# Patient Record
Sex: Female | Born: 1990 | Race: Black or African American | Hispanic: No | Marital: Single | Smoking: Never smoker
Health system: Southern US, Community
[De-identification: ages and names within clinical notes are randomized; demographics above are authoritative.]

## PROBLEM LIST (undated history)

## (undated) DIAGNOSIS — D649 Anemia, unspecified: Secondary | ICD-10-CM

## (undated) DIAGNOSIS — F32A Depression, unspecified: Secondary | ICD-10-CM

## (undated) DIAGNOSIS — J45909 Unspecified asthma, uncomplicated: Secondary | ICD-10-CM

## (undated) DIAGNOSIS — F419 Anxiety disorder, unspecified: Secondary | ICD-10-CM

## (undated) DIAGNOSIS — S329XXA Fracture of unspecified parts of lumbosacral spine and pelvis, initial encounter for closed fracture: Secondary | ICD-10-CM

## (undated) DIAGNOSIS — M1990 Unspecified osteoarthritis, unspecified site: Secondary | ICD-10-CM

## (undated) DIAGNOSIS — Z789 Other specified health status: Secondary | ICD-10-CM

## (undated) HISTORY — PX: COLOSTOMY: SHX63

## (undated) HISTORY — PX: BONY PELVIS SURGERY: SHX572

---
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
## 2013-04-17 ENCOUNTER — Emergency Department (HOSPITAL_COMMUNITY)
Admission: EM | Admit: 2013-04-17 | Discharge: 2013-04-18 | Disposition: A | Discharge status: Other Acute Inpatient Hospital | Payer: 59 | Attending: Emergency Medicine | Admitting: Emergency Medicine

## 2013-04-17 ENCOUNTER — Encounter (HOSPITAL_COMMUNITY): Payer: Self-pay | Admitting: Emergency Medicine

## 2013-04-17 DIAGNOSIS — F411 Generalized anxiety disorder: Secondary | ICD-10-CM | POA: Insufficient documentation

## 2013-04-17 DIAGNOSIS — Z8781 Personal history of (healed) traumatic fracture: Secondary | ICD-10-CM | POA: Insufficient documentation

## 2013-04-17 DIAGNOSIS — F3289 Other specified depressive episodes: Secondary | ICD-10-CM | POA: Insufficient documentation

## 2013-04-17 DIAGNOSIS — F419 Anxiety disorder, unspecified: Secondary | ICD-10-CM

## 2013-04-17 DIAGNOSIS — Z3202 Encounter for pregnancy test, result negative: Secondary | ICD-10-CM | POA: Insufficient documentation

## 2013-04-17 DIAGNOSIS — J45909 Unspecified asthma, uncomplicated: Secondary | ICD-10-CM | POA: Insufficient documentation

## 2013-04-17 DIAGNOSIS — F329 Major depressive disorder, single episode, unspecified: Secondary | ICD-10-CM

## 2013-04-17 DIAGNOSIS — F32A Depression, unspecified: Secondary | ICD-10-CM

## 2013-04-17 HISTORY — DX: Unspecified asthma, uncomplicated: J45.909

## 2013-04-17 LAB — CBC
HCT: 38.8 % (ref 36.0–46.0)
Hemoglobin: 13.1 g/dL (ref 12.0–15.0)
MCH: 31.5 pg (ref 26.0–34.0)
MCHC: 33.8 g/dL (ref 30.0–36.0)
MCV: 93.3 fL (ref 78.0–100.0)
PLATELETS: 182 10*3/uL (ref 150–400)
RBC: 4.16 MIL/uL (ref 3.87–5.11)
RDW: 11.9 % (ref 11.5–15.5)
WBC: 7 10*3/uL (ref 4.0–10.5)

## 2013-04-17 LAB — RAPID URINE DRUG SCREEN, HOSP PERFORMED
Amphetamines: NOT DETECTED
BARBITURATES: NOT DETECTED
BENZODIAZEPINES: NOT DETECTED
COCAINE: NOT DETECTED
Opiates: POSITIVE — AB
TETRAHYDROCANNABINOL: POSITIVE — AB

## 2013-04-17 LAB — POC URINE PREG, ED: Preg Test, Ur: NEGATIVE
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
## 2013-04-18 ENCOUNTER — Encounter (HOSPITAL_COMMUNITY): Payer: Self-pay | Admitting: *Deleted

## 2013-04-18 ENCOUNTER — Inpatient Hospital Stay (HOSPITAL_COMMUNITY)
Admission: AD | Admit: 2013-04-18 | Discharge: 2013-04-24 | DRG: 885 | Disposition: A | Discharge status: Home / Self Care | Payer: 59 | Source: Intra-hospital | Attending: Psychiatry | Admitting: Psychiatry

## 2013-04-18 DIAGNOSIS — F411 Generalized anxiety disorder: Secondary | ICD-10-CM

## 2013-04-18 DIAGNOSIS — J45909 Unspecified asthma, uncomplicated: Secondary | ICD-10-CM | POA: Diagnosis present

## 2013-04-18 DIAGNOSIS — R45851 Suicidal ideations: Secondary | ICD-10-CM

## 2013-04-18 DIAGNOSIS — F4310 Post-traumatic stress disorder, unspecified: Secondary | ICD-10-CM | POA: Diagnosis present

## 2013-04-18 DIAGNOSIS — F1210 Cannabis abuse, uncomplicated: Secondary | ICD-10-CM | POA: Diagnosis present

## 2013-04-18 DIAGNOSIS — F332 Major depressive disorder, recurrent severe without psychotic features: Secondary | ICD-10-CM | POA: Diagnosis present

## 2013-04-18 DIAGNOSIS — Z833 Family history of diabetes mellitus: Secondary | ICD-10-CM

## 2013-04-18 DIAGNOSIS — F329 Major depressive disorder, single episode, unspecified: Secondary | ICD-10-CM

## 2013-04-18 DIAGNOSIS — F1010 Alcohol abuse, uncomplicated: Secondary | ICD-10-CM | POA: Diagnosis present

## 2013-04-18 DIAGNOSIS — F339 Major depressive disorder, recurrent, unspecified: Secondary | ICD-10-CM | POA: Diagnosis present

## 2013-04-18 DIAGNOSIS — F3289 Other specified depressive episodes: Secondary | ICD-10-CM

## 2013-04-18 LAB — COMPREHENSIVE METABOLIC PANEL
ALBUMIN: 4.4 g/dL (ref 3.5–5.2)
ALT: 12 U/L (ref 0–35)
AST: 17 U/L (ref 0–37)
Alkaline Phosphatase: 95 U/L (ref 39–117)
BUN: 11 mg/dL (ref 6–23)
CALCIUM: 10.2 mg/dL (ref 8.4–10.5)
CO2: 25 mEq/L (ref 19–32)
Chloride: 102 mEq/L (ref 96–112)
Creatinine, Ser: 0.67 mg/dL (ref 0.50–1.10)
GFR calc Af Amer: >90 mL/min (ref >90)
GFR calc non Af Amer: >90 mL/min (ref >90)
Glucose, Bld: 95 mg/dL (ref 70–99)
Potassium: 4 mEq/L (ref 3.7–5.3)
Sodium: 141 mEq/L (ref 137–147)
Total Bilirubin: <0.2 mg/dL — ABNORMAL LOW (ref 0.3–1.2)
Total Protein: 8.2 g/dL (ref 6.0–8.3)

## 2013-04-18 LAB — ETHANOL: Alcohol, Ethyl (B): <11 mg/dL (ref 0–11)

## 2013-04-18 MED ORDER — HYDROXYZINE HCL 50 MG PO TABS
50.0000 mg | ORAL_TABLET | Freq: Every evening | ORAL | Status: DC | PRN
  Administered 2013-04-18 – 2013-04-20 (×3): 50 mg via ORAL
  Filled 2013-04-18 (×3): qty 1

## 2013-04-18 MED ORDER — ONDANSETRON HCL 4 MG PO TABS: 4.0000 mg | ORAL_TABLET | Freq: Three times a day (TID) | ORAL | Status: DC | PRN

## 2013-04-18 MED ORDER — ALUM & MAG HYDROXIDE-SIMETH 200-200-20 MG/5ML PO SUSP: 30.0000 mL | ORAL | Status: DC | PRN

## 2013-04-18 MED ORDER — ALUM & MAG HYDROXIDE-SIMETH 200-200-20 MG/5ML PO SUSP
30.0000 mL | ORAL | Status: DC | PRN
Start: 2013-04-18 — End: 2013-04-18

## 2013-04-18 MED ORDER — ACETAMINOPHEN 325 MG PO TABS
650.0000 mg | ORAL_TABLET | ORAL | Status: DC | PRN
Start: 2013-04-18 — End: 2013-04-24
  Administered 2013-04-19: 650 mg via ORAL
  Filled 2013-04-18: qty 2

## 2013-04-18 MED ORDER — IBUPROFEN 200 MG PO TABS: 600.0000 mg | ORAL_TABLET | Freq: Three times a day (TID) | ORAL | Status: DC | PRN

## 2013-04-18 MED ORDER — MAGNESIUM HYDROXIDE 400 MG/5ML PO SUSP: 30.0000 mL | Freq: Every day | ORAL | Status: DC | PRN

## 2013-04-18 MED ORDER — ACETAMINOPHEN 325 MG PO TABS: 650.0000 mg | ORAL_TABLET | ORAL | Status: DC | PRN

## 2013-04-18 NOTE — BH Assessment (Signed)
[facility] Gilmore LarocheAkhtar assigned to bed 602-2. Support paperwork is complete.  Glorious PeachNajah Tadarrius Burch, MS, LCASA Assessment Counselor

## 2013-04-18 NOTE — ED Notes (Addendum)
Pt has 3 bags of personal belongings at triage.
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
## 2013-04-18 NOTE — Progress Notes (Signed)
Recreation Therapy Notes  Date: 02.27.2015 Time: 2:45pm Location: 100 Hall Dayroom    Group Topic: Communication, Team Building, Problem Solving  Goal Area(s) Addresses:  Patient will effectively work with peer towards shared goal.  Patient will identify skill used to make activity successful.  Patient will identify how skills used during activity can be used to build healthy support system.   Behavioral Response: Did not attend.   Marykay Lexenise L Eulan Heyward, LRT/CTRS  Surya Schroeter L 04/18/2013 4:18 PM

## 2013-04-18 NOTE — Progress Notes (Signed)
[facility] Group Notes:  (Nursing/MHT/Case Management/Adjunct)  Date:  04/18/2013  Time:  8:00 p.m.   Type of Therapy:  Psychoeducational Skills  Participation Level:  Active  Participation Quality:  Attentive  Affect:  Depressed  Cognitive:  Appropriate  Insight:  Improving  Engagement in Group:  Developing/Improving  Modes of Intervention:  Education  Summary of Progress/Problems: The patient verbalized that she had a good day overall. The patient shared that today was her first day in the hospital and that she was feeling better already. In terms of the theme of the day, her relapse prevention will involve taking "showers", writing, and coloring.   Hazle CocaGOODMAN, Maria Coin S 04/18/2013, 9:52 PM

## 2013-04-18 NOTE — ED Notes (Signed)
Patient appears drowsy. Denies SI, HI, AVH at present.   Belongings reviewed, environment adjusted, encouragement offered.  Patient safety maintained, Q 15 checks in place.

## 2013-04-18 NOTE — Progress Notes (Signed)
MHT intiated outside bed placement for treatment at the following hospitals with bed availability:  1)[place] 2)[facility] 3)[facility] 4[facility] 5)[facility] 7)[facility] 8)[facility]  Blain PaisMichelle L Les Longmore, MHT/NS
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
## 2013-04-18 NOTE — ED Notes (Signed)
Telepsych in progress. 

## 2013-04-18 NOTE — Tx Team (Signed)
Initial Interdisciplinary Treatment Plan  PATIENT STRENGTHS: (choose at least two) Ability for insight Average or above average intelligence Capable of independent living Supportive family/friends  PATIENT STRESSORS: Financial difficulties Legal issue Marital or family conflict   PROBLEM LIST: Problem List/Patient Goals Date to be addressed Date deferred Reason deferred Estimated date of resolution  depression 04/18/2013     anxiety 04/18/2013                                                DISCHARGE CRITERIA:  Improved stabilization in mood, thinking, and/or behavior Need for constant or close observation no longer present Reduction of life-threatening or endangering symptoms to within safe limits  PRELIMINARY DISCHARGE PLAN: Outpatient therapy Return to previous living arrangement  PATIENT/FAMIILY INVOLVEMENT: This treatment plan has been presented to and reviewed with the patient, Ernesto RutherfordYadyra M Andreas.  The patient and family have been given the opportunity to ask questions and make suggestions.  Leighton Parodyyson, Claudia Greenley M 04/18/2013, 4:44 PM

## 2013-04-18 NOTE — ED Notes (Signed)
Pt is awake and alert, pleasant and cooperative. Patient denies HI, SI AH or VH. Discharge vitals 99/69 HR 88 RR 16 and unlabored. [facility]. Will continue to monitor for safety. Patient escorted to lobby with [facility] transportation and MHTs  without incident. T.Melvyn NethLewis RN

## 2013-04-19 ENCOUNTER — Encounter (HOSPITAL_COMMUNITY): Payer: Self-pay | Admitting: Psychiatry

## 2013-04-19 DIAGNOSIS — F411 Generalized anxiety disorder: Secondary | ICD-10-CM | POA: Diagnosis present

## 2013-04-19 DIAGNOSIS — F1910 Other psychoactive substance abuse, uncomplicated: Secondary | ICD-10-CM

## 2013-04-19 MED ORDER — IBUPROFEN 800 MG PO TABS
800.0000 mg | ORAL_TABLET | Freq: Three times a day (TID) | ORAL | Status: DC | PRN
  Administered 2013-04-19 – 2013-04-24 (×11): 800 mg via ORAL
  Filled 2013-04-19 (×12): qty 1

## 2013-04-19 MED ORDER — HYDROXYZINE HCL 25 MG PO TABS
25.0000 mg | ORAL_TABLET | Freq: Four times a day (QID) | ORAL | Status: DC | PRN
  Administered 2013-04-22 – 2013-04-24 (×4): 25 mg via ORAL
  Filled 2013-04-19 (×5): qty 1

## 2013-04-19 MED ORDER — SERTRALINE HCL 25 MG PO TABS
25.0000 mg | ORAL_TABLET | Freq: Every day | ORAL | Status: DC
  Administered 2013-04-19 – 2013-04-22 (×4): 25 mg via ORAL
  Filled 2013-04-19 (×6): qty 1
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
## 2013-04-19 NOTE — Progress Notes (Signed)
Psychoeducational Group Note  Date:  04/19/2013 Time:  8:00 pm.   Group Topic/Focus:  Wrap-Up Group:   The focus of this group is to help patients review their daily goal of treatment and discuss progress on daily workbooks.  Participation Level: Did Not Attend  Participation Quality:  Not Applicable  Affect:  Not Applicable  Cognitive:  Not Applicable  Insight:  Not Applicable  Engagement in Group: Not Applicable  Additional Comments:  The patient didn't attend group this evening since she was asleep in her bed.   Hazle CocaGOODMAN, Chardae Mulkern S 04/19/2013, 9:20 PM
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
## 2013-04-19 NOTE — Progress Notes (Signed)
D.  Pt pleasant on approach, requested heat packs for arthritis.  Did not attend evening wrap up group.  Interacting appropriately with peers on unit.  Denies SI/HI/hallucinations at this time.  A.  Support and encouragement offered.  Few heat packs available, went to children's unit and got three  R.  Pt remains safe on unit, pleased to receive heat packs, will continue to monitor.
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
## 2013-04-19 NOTE — [facility] Group Notes (Signed)
[facility] Group Notes:  (Clinical Social Work)  04/19/2013   1:15-2:15PM  Summary of Progress/Problems:   The main focus of today's process group was for the patient to identify ways in which they have sabotaged their own mental health wellness/recovery.  Motivational interviewing and a handout were used to explore the benefits and costs of their self-sabotaging behavior as well as the benefits and costs of changing this behavior.  The Stages of Change were explained to the group using a handout, and it was explained to patients the importance of making specific plans for how to handle self-sabotaging behaviors. The patient expressed she self-sabotages with acting as though she does not care, isolating herself emotionally.  The benefit of this self-defeating behavior is that it makes things not real and pushes people away so she cannot be hurt and the cost is that in reality she truly does have feelings that are being denied.  The benefit for her of changing this would be to be able to look at everything in life differently.  Type of Therapy:  Process Group  Participation Level:  Active  Participation Quality:  Attentive, Sharing and Supportive  Affect:  Appropriate  Cognitive:  Appropriate  Insight:  Engaged  Engagement in Therapy:  Engaged  Modes of Intervention:  Education, Motivational Interviewing   Whitney MantleMareida Grossman-Orr, LCSW 04/19/2013, 4:00pm

## 2013-04-19 NOTE — H&P (Signed)
  Pt was seen by me today and I agree with the key elements documented in H&P.  

## 2013-04-19 NOTE — Progress Notes (Signed)
Whitney Simpson is OOB UAL on the unit, interacting with her peers and the staff appropriately. She takes her meds as planned and she attends her groups.   A She completed her morning self inventory and on it she writes she denies SI within the past 24 hrs, she rates her depression and hopelessness  "7/4" and she stated her DC plan is to " either change my surroundings or change the people I'm dealing with".   R Safety is in place and poc moves forward.

## 2013-04-19 NOTE — [facility] Suicide Risk Assessment (Signed)
Suicide Risk Assessment  Admission Assessment     Nursing information obtained from:  Patient Demographic factors:  Adolescent or young adult;Low socioeconomic status;Unemployed Current Mental Status:  Self-harm thoughts Loss Factors:  Legal issues;Financial problems / change in socioeconomic status Historical Factors:  Prior suicide attempts Risk Reduction Factors:  Responsible for children under 21 years of age;Sense of responsibility to family Total Time spent with patient: 30 minutes  CLINICAL FACTORS:   Depression:   Hopelessness    COGNITIVE FEATURES THAT CONTRIBUTE TO RISK:  Closed-mindedness    SUICIDE RISK:   Mild:  Suicidal ideation of limited frequency, intensity, duration, and specificity.  There are no identifiable plans, no associated intent, mild dysphoria and related symptoms, good self-control (both objective and subjective assessment), few other risk factors, and identifiable protective factors, including available and accessible social support.  PLAN OF CARE:  Continue current treatment  I certify that inpatient services furnished can reasonably be expected to improve the patient's condition.  Wonda CeriseRasul, Bobie Caris 04/19/2013, 12:55 PM

## 2013-04-20 DIAGNOSIS — F411 Generalized anxiety disorder: Secondary | ICD-10-CM

## 2013-04-20 DIAGNOSIS — F332 Major depressive disorder, recurrent severe without psychotic features: Principal | ICD-10-CM

## 2013-04-20 DIAGNOSIS — R45851 Suicidal ideations: Secondary | ICD-10-CM

## 2013-04-20 DIAGNOSIS — F4310 Post-traumatic stress disorder, unspecified: Secondary | ICD-10-CM

## 2013-04-20 DIAGNOSIS — F1010 Alcohol abuse, uncomplicated: Secondary | ICD-10-CM

## 2013-04-20 MED ORDER — BUTALBITAL-APAP-CAFFEINE 50-325-40 MG PO TABS
1.0000 | ORAL_TABLET | Freq: Four times a day (QID) | ORAL | Status: DC | PRN
  Administered 2013-04-20 – 2013-04-22 (×4): 1 via ORAL
  Filled 2013-04-20 (×4): qty 1

## 2013-04-20 MED ORDER — HYDROCORTISONE 0.5 % EX CREA
TOPICAL_CREAM | Freq: Four times a day (QID) | CUTANEOUS | Status: DC
  Administered 2013-04-21 – 2013-04-23 (×6): via TOPICAL
  Filled 2013-04-20 (×2): qty 28.35

## 2013-04-20 NOTE — Progress Notes (Signed)
[facility] Group Notes:  (Nursing/MHT/Case Management/Adjunct)  Date:  04/20/2013  Time:  8:00 p.m.   Type of Therapy:  Psychoeducational Skills  Participation Level:  Active  Participation Quality:  Attentive  Affect:  Flat  Cognitive:  Appropriate  Insight:  Improving  Engagement in Group:  Limited  Modes of Intervention:  Education  Summary of Progress/Problems: The patient described her day as having been "complicated". She explained to the group that she had a bad telephone call earlier in the day in which she learned that she will no longer have the family support that she expected. As a theme for the day, she was unable to list her support system.   Hazle CocaGOODMAN, Daiden Coltrane S 04/20/2013, 9:03 PM

## 2013-04-20 NOTE — Progress Notes (Signed)
Late Entry for 04-19-13 Entered on 04-20-13  .Psychoeducational Group Note    Date: 04/20/2013 Time: 0930   Goal Setting Purpose of Group: To be able to set a goal that is measurable and that can be accomplished in one day Participation Level:  Active  Participation Quality:  Appropriate  Affect:  Appropriate  Cognitive:  Oriented  Insight:  Improving  Engagement in Group:  Engaged  Additional Comments:    Khloee Garza A 

## 2013-04-20 NOTE — Progress Notes (Signed)
Psychoeducational Group Note  Date: 04/20/2013 Time:  0930  Group Topic/Focus:  Gratefulness:  The focus of this group is to help patients identify what two things they are most grateful for in their lives. What helps ground them and to center them on their work to their recovery.  Participation Level:  Active  Participation Quality:  Appropriate  Affect:  Appropriate  Cognitive:  Oriented  Insight:  improving  Engagement in Group:  Engaged  Additional Comments:    Kendrell Lottman A   

## 2013-04-20 NOTE — Progress Notes (Signed)
Psychoeducational Group Note  Date: 04/20/2013 Time:  0930  Group Topic/Focus:  Gratefulness:  The focus of this group is to help patients identify what two things they are most grateful for in their lives. What helps ground them and to center them on their work to their recovery.  Participation Level:  Active  Participation Quality:  Appropriate  Affect:  Appropriate  Cognitive:  Oriented  Insight:  improving  Engagement in Group:  Engaged  Additional Comments:    Aj Crunkleton A   
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
## 2013-04-20 NOTE — Progress Notes (Signed)
D.  Pt pleasant on approach, does have complaint of headache that she says is becoming a migraine..  Pt also has small red area under elbow that itches and requested cream for this.   Denies SI/HI/hallucinations at this time.  Positive for evening wrap up group, interacting appropriately within milieu.  A.  Support and encouragement offered.  Called NP to get orders for migraine and raised red area, orders received.  R.  Pt pleased with new order, went to bed early due to headache.

## 2013-04-20 NOTE — Progress Notes (Signed)
Late entry for 04-19-13 Entered on 04-20-13  Psychoeducational Group Note  Date: 04/20/2013 Time:  1015  Group Topic/Focus:  Identifying Needs:   The focus of this group is to help patients identify their personal needs that have been historically problematic and identify healthy behaviors to address their needs.  Participation Level:  Active  Participation Quality:  Appropriate  Affect:  Appropriate  Cognitive:  Oriented  Insight:  Improving  Engagement in Group:  Engaged  Additional Comments:    Adreyan Carbajal A  

## 2013-04-21 LAB — RPR: RPR: NONREACTIVE

## 2013-04-21 MED ORDER — TRAZODONE HCL 50 MG PO TABS
50.0000 mg | ORAL_TABLET | Freq: Every evening | ORAL | Status: DC | PRN
  Administered 2013-04-21 – 2013-04-23 (×3): 50 mg via ORAL
  Filled 2013-04-21 (×3): qty 1

## 2013-04-21 NOTE — Clinical Social Work Note (Signed)
CSW contacted [name] [phone], ext [phone]) per pt's request.  Consent received and in the chart.  CSW informed Ms. Whitney Simpson of pt's admission date and currently still being in the hospital.    Whitney IvanChelsea Horton, LCSW 04/21/2013  2:54 PM
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
## 2013-04-21 NOTE — Tx Team (Signed)
Interdisciplinary Treatment Plan Update (Adult)  Date: 04/21/2013  Time Reviewed:  9:45 AM  Progress in Treatment: Attending groups: Yes Participating in groups:  Yes Taking medication as prescribed:  Yes Tolerating medication:  Yes Family/Significant othe contact made: CSW assessing  Patient understands diagnosis:  Yes Discussing patient identified problems/goals with staff:  Yes Medical problems stabilized or resolved:  Yes Denies suicidal/homicidal ideation: Yes Issues/concerns per patient self-inventory:  Yes Other:  New problem(s) identified: N/A  Discharge Plan or Barriers: CSW assessing for appropriate referrals.  Reason for Continuation of Hospitalization: Anxiety Depression Medication Stabilization  Comments: N/A  Estimated length of stay: 3-5 days  For review of initial/current patient goals, please see plan of care.  Attendees: Patient:     Family:     Physician:  Dr. Johnalagadda 04/21/2013 10:51 AM   Nursing:   Carol Davis, RN 04/21/2013 10:51 AM   Clinical Social Worker:  Consandra Laske Horton, LCSW 04/21/2013 10:51 AM   Other: Conrad Withrow, PA 04/21/2013 10:51 AM   Other:  Valerie Noch, care coordination 04/21/2013 10:51 AM   Other:  Quylle Hodnett, LCSW 04/21/2013 10:51 AM   Other:  Beverly Knight, RN 04/21/2013 10:51 AM   Other:  Dana Green, RN 04/21/2013 10:53 AM   Other: Xavier Pandimakeel, LCSW 04/21/2013 10:53 AM   Other: Kim Maggio, RN 04/21/2013 10:53 AM   Other:    Other:    Other:     Scribe for Treatment Team:   Horton, Rudie Rikard Nicole, 04/21/2013 10:51 AM    

## 2013-04-21 NOTE — [facility] Suicide Risk Assessment (Signed)
[facility] Family/Significant Other Suicide Prevention Education  Suicide Prevention Education:   Patient Refusal for Family/Significant Other Suicide Prevention Education: The patient has refused to provide written consent for family/significant other to be provided Family/Significant Other Suicide Prevention Education during admission and/or prior to discharge.  Physician notified.  CSW provided suicide prevention information with patient.    The suicide prevention education provided includes the following:  Suicide risk factors  Suicide prevention and interventions  National Suicide Hotline telephone number  [facility] assessment telephone number  [facility] Emergency Assistance 911  [facility] and/or Residential Mobile Crisis Unit telephone number   Reyes IvanChelsea Horton, [place] 04/21/2013 2:57 PM

## 2013-04-22 LAB — GC/CHLAMYDIA PROBE AMP
CT Probe RNA: NEGATIVE
GC Probe RNA: NEGATIVE

## 2013-04-22 LAB — HIV ANTIBODY (ROUTINE TESTING W REFLEX): HIV: NONREACTIVE

## 2013-04-22 MED ORDER — SERTRALINE HCL 50 MG PO TABS
50.0000 mg | ORAL_TABLET | Freq: Every day | ORAL | Status: DC
  Administered 2013-04-23 – 2013-04-24 (×2): 50 mg via ORAL
  Filled 2013-04-22: qty 14
  Filled 2013-04-22 (×4): qty 1
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
## 2013-04-22 NOTE — [facility] Group Notes (Signed)
[facility] LCSW Group Therapy  04/22/2013  1:15 PM   Type of Therapy:  Group Therapy  Participation Level:  Active  Participation Quality:  Attentive, Sharing and Supportive  Affect:  Depressed and Flat  Cognitive:  Alert and Oriented  Insight:  Developing/Improving and Engaged  Engagement in Therapy:  Developing/Improving and Engaged  Modes of Intervention:  Activity, Clarification, Confrontation, Discussion, Education, Exploration, Limit-setting, Orientation, Problem-solving, Rapport Building, Dance movement psychotherapisteality Testing, Socialization and Support  Summary of Progress/Problems:Patient was attentive and engaged with speaker from [employer].  Patient was attentive to speaker while they shared their story of dealing with mental health and overcoming it.  Patient expressed interest in their programs and services and received information on their agency.  Patient processed ways they can relate to the speaker.  Pt asked multiple questions to help relate to the speaker and her situation.    Whitney IvanChelsea Horton, LCSW 04/22/2013 2:33 PM

## 2013-04-22 NOTE — [facility] Group Notes (Signed)
Adult Psychoeducational Group Note  Date:  04/22/2013 Time:  11:58 PM  Group Topic/Focus:  Wrap-Up Group:   The focus of this group is to help patients review their daily goal of treatment and discuss progress on daily workbooks.  Participation Level:  Did Not Attend  Participation Quality:  None  Affect:  None  Cognitive:  None  Insight: None  Engagement in Group:  None  Modes of Intervention:  None  Additional Comments:  Pt stated that she didn't want to attend group due to not wanting to be around a certain patient. Pt elected to miss group and stay in her room after encouragement, of staff to attend.  Whitney Simpson, Whitney Simpson 04/22/2013, 11:58 PM

## 2013-04-22 NOTE — Progress Notes (Signed)
The focus of this group is to educate the patient on the purpose and policies of crisis stabilization and provide a format to answer questions about their admission.  The group details unit policies and expectations of patients while admitted. Patient attended this group. 
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
## 2013-04-22 NOTE — Progress Notes (Signed)
Pt reports she is doing a little better today.  Her primary complaint is her chronic pain to her R hip and the migraine that she says has persisted for the last couple of days.  Meds given as ordered.  She denies SI/HI/AV at this time.  She says she has been participating and going to groups.  Pt makes her needs known to staff.  Support and encouragement offered.  Discharge plans are still in process.  Safety maintained with q15 minute checks.

## 2013-04-22 NOTE — Progress Notes (Signed)
D: Patient denies SI/HI and A/V hallucinations; patient reports sleep is fair and that she requested medication; reports appetite is improving ; reports energy level is normal ; reports ability to pay attention is good; rates depression as 7/10; rates hopelessness 2/10; patient reports " im scared, nervous, and worried about the changes to make to better take care of herself"; patient had some complaints of anxiety and cramps  A: Monitored q 15 minutes; patient encouraged to attend groups; patient educated about medications; patient given medications per physician orders; patient encouraged to express feelings and/or concerns  R: Patient is cooperative and appropriate to circumstances; patient reports relief of the anxiety after the medication; patient reports relief of cramps after medication and heat packs; patient's interaction with staff and peers is appropriate but can make inappropriate statements at times; patient was able to set goal to talk with staff 1:1 when having feelings of SI; patient is taking medications as prescribed and tolerating medications; patient is attending all groups

## 2013-04-22 NOTE — Progress Notes (Signed)
Recreation Therapy Notes  Animal-Assisted Activity/Therapy (AAA/T) Program Checklist/Progress Notes Patient Eligibility Criteria Checklist & Daily Group note for Rec Tx Intervention  Date: 03.03.2015 Time: 2:45pm Location: [address] Dayroom    AAA/T Program Assumption of Risk Form signed by Patient/ or Parent Legal Guardian yes  Patient is free of allergies or sever asthma yes  Patient reports no fear of animals yes  Patient reports no history of cruelty to animals yes   Patient understands his/her participation is voluntary yes  Behavioral Response: Did not attend.   Jahni Nazar L Niyla Marone, LRT/CTRS  Darin Redmann L 04/22/2013 4:57 PM 

## 2013-04-23 NOTE — Tx Team (Signed)
Interdisciplinary Treatment Plan Update (Adult)  Date: 04/23/2013  Time Reviewed:  9:45 AM  Progress in Treatment: Attending groups: Yes Participating in groups:  Yes Taking medication as prescribed:  Yes Tolerating medication:  Yes Family/Significant othe contact made: No, pt refused Patient understands diagnosis:  Yes Discussing patient identified problems/goals with staff:  Yes Medical problems stabilized or resolved:  Yes Denies suicidal/homicidal ideation: Yes Issues/concerns per patient self-inventory:  Yes Other:  New problem(s) identified: N/A  Discharge Plan or Barriers: [facility] [place], [place] for [place] medication management and therapy.    Reason for Continuation of Hospitalization: Anxiety Depression Medication Stabilization  Comments: N/A  Estimated length of stay: 1 day, d/c tomorrow  For review of initial/current patient goals, please see plan of care.  Attendees: Patient:     Family:     Physician:  Dr. Javier GlazierJohnalagadda 04/23/2013 9:59 AM   Nursing:   Marzetta Boardhrista Dopson, RN 04/23/2013 9:59 AM   Clinical Social Worker:  Reyes Ivanhelsea Horton, LCSW 04/23/2013 9:59 AM   Other: Claudette Headonrad Withrow, PA 04/23/2013 9:59 AM   Other:  Sherrye PayorValerie Noch, care coordination 04/23/2013 9:59 AM   Other:  Juline PatchQuylle Hodnett, LCSW 04/23/2013 9:59 AM   Other:  Harold Barbanonecia Byrd, RN 04/23/2013 10:00 AM   Other: Leighton ParodyBritney Tyson, RN 04/23/2013 10:00 AM   Other: Onnie BoerJennifer Clark, case manager 04/23/2013 10:03 AM   Other:    Other:    Other:      Scribe for Treatment Team:   Carmina MillerHorton, Amiel Sharrow Nicole, 04/23/2013 , 9:59 AM

## 2013-04-23 NOTE — Progress Notes (Signed)
Adult Psychoeducational Group Note  Date:  04/23/2013 Time:  9:55 PM  Group Topic/Focus:  Wrap-Up Group:   The focus of this group is to help patients review their daily goal of treatment and discuss progress on daily workbooks.  Participation Level:  Minimal  Participation Quality:  Appropriate  Affect:  Defensive  Cognitive:  Appropriate  Insight: Limited  Engagement in Group:  Limited  Modes of Intervention:  Education, Exploration, Socialization and Support  Additional Comments:  Patient attended and participated in group tonight. She reports that she went to her groups, attended her meals and went outside with the group today. For her personal development she is planning on going back to school,  Scot DockFrancis, Undrea Shipes Dacosta 04/23/2013, 9:55 PM

## 2013-04-23 NOTE — Progress Notes (Signed)
D: Patient denies SI/HI and A/V hallucinations; patient having complaints of anxiety this morning and she was reported that shen she takes the vistaril it helps with her anger but that she did not feel calm  A: Monitored q 15 minutes; patient encouraged to attend groups; patient educated about medications; patient given medications per physician orders; patient encouraged to express feelings and/or concerns  R: Patient given vistaril this morning and has been in the bed sleep today and has not come out of the room; patient's interaction with staff and peers is appropriate; patient was able to set goal to talk with staff 1:1 when having feelings of SI; patient is taking medications as prescribed and tolerating medications

## 2013-04-23 NOTE — [facility] Group Notes (Signed)
[facility]   04/23/2013 8:45 AM  Participation Quality:  Alert, Appropriate and Oriented  Mood/Affect:  Flat and Depressed  Depression Rating:  2  Anxiety Rating:  4  Thoughts of Suicide:  Pt denies SI/HI  Will you contract for safety?   Yes  Current AVH:  Pt denies  Plan for Discharge/Comments:  Pt attended discharge planning group and actively participated in group.  CSW provided pt with today's workbook.  Pt reports feeling ready to d/c soon.  Pt states that she realizes she has options now, such as shelters here in [place] and her grandmother said she can stay with her as well.  Pt states that she plans to work on staying here in [place] but also has a Nurse, mental healthtrain ticket back to [place] and can go back to her boyfriend if need be.  Pt states that her boyfriend is willing to go to therapy with her.  CSW [facility] [place] for outpatient medication management and therapy, in both cities, should pt decide to go back to [place].  No further needs voiced by pt at this time.    Transportation Means: Pt reports access to transportation - provided pt with bus passes and pt has a train ticket to [place]  Supports: No supports mentioned at this time  Reyes IvanChelsea Horton, LCSW 04/23/2013 9:52 AM

## 2013-04-23 NOTE — Progress Notes (Signed)
Pt reports she is doing good this evening.  She says she is being discharged tomorrow.  She plans to stay at a shelter for a while because she is still unsure about going home to her boyfriend.  She has a ticket in hand if she decides to return to [place] from.  Pt feels her medications are working for her.  The rash on her arm looks better.  She feels more hopeful instead of helpless.  Pt makes her needs known to staff, and voices no needs or concerns at this time.  Pt attends groups and participates.  Support and encouragement offered. Safety maintained with q15 minute checks.

## 2013-04-23 NOTE — Progress Notes (Signed)
Pt observed in the hallway with other patients talking and laughing with one another.  Pt has not made any complaints or voiced any concerns to this Clinical research associatewriter.  She says her cramps have decreased.  Pt says she has gone to most of the groups.  Pt feels the meds are working for her.  Pt denies SI/HI/AV.  Pt makes her needs known to staff.  Pt says she will probably go to a shelter from here at discharge.  Support and encouragement offered.  Safety maintained with q15 minute checks.

## 2013-04-23 NOTE — Progress Notes (Signed)
Patient ID: Whitney RutherfordYadyra M Simpson, female   DOB: 05/13/1990, 23 y.o.   MRN: [id number] Pt did not attend 10 am Psychoeducational group.

## 2013-04-23 NOTE — [facility] Group Notes (Signed)
[facility] LCSW Group Therapy  04/23/2013  1:15 PM   Type of Therapy:  Group Therapy  Participation Level:  Active  Participation Quality:  Attentive, Sharing and Supportive  Affect:  Depressed and Flat  Cognitive:  Alert and Oriented  Insight:  Developing/Improving and Engaged  Engagement in Therapy:  Developing/Improving and Engaged  Modes of Intervention:  Clarification, Confrontation, Discussion, Education, Exploration, Limit-setting, Orientation, Problem-solving, Rapport Building, Dance movement psychotherapisteality Testing, Socialization and Support  Summary of Progress/Problems: The topic for group today was emotional regulation.  This group focused on both positive and negative emotion identification and allowed group members to process ways to identify feelings, regulate negative emotions, and find healthy ways to manage internal/external emotions. Group members were asked to reflect on a time when their reaction to an emotion led to a negative outcome and explored how alternative responses using emotion regulation would have benefited them. Group members were also asked to discuss a time when emotion regulation was utilized when a negative emotion was experienced.  Pt shared that she struggles with the emotions of anger and frustration.  Pt explained that she doesn't like feeling controlled and lashes out when she feels she isn't in control.  Pt states that from her behaviors she feels she's pushed people away.  Pt states that writing and coloring helps her calm down and think before acting on negative emotions.  Pt actively participated and was engaged in group discussion.    Whitney IvanChelsea Horton, LCSW 04/23/2013 3:15 PM
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
## 2013-04-24 MED ORDER — HYDROXYZINE HCL 25 MG PO TABS
25.0000 mg | ORAL_TABLET | Freq: Four times a day (QID) | ORAL | Status: DC | PRN
Start: 2013-04-24 — End: 2020-06-19

## 2013-04-24 MED ORDER — SERTRALINE HCL 50 MG PO TABS: 50.0000 mg | ORAL_TABLET | Freq: Every day | ORAL | Status: DC

## 2013-04-24 MED ORDER — TRAZODONE HCL 50 MG PO TABS: 50.0000 mg | ORAL_TABLET | Freq: Every evening | ORAL | Status: DC | PRN

## 2013-04-24 MED ORDER — BUTALBITAL-APAP-CAFFEINE 50-325-40 MG PO TABS: 1.0000 | ORAL_TABLET | Freq: Four times a day (QID) | ORAL | Status: DC | PRN
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
## 2013-04-24 NOTE — Progress Notes (Signed)
Patient ID: Whitney RutherfordYadyra M Mackiewicz, female   DOB: 1990/07/31, 23 y.o.   MRN: [id number]  Patient's prescriptions were sent to the patient's pharmacy per patient's request. Writer called to follow-up and verbally gave Judeth CornfieldStephanie the pharmacist the list of medications as well.

## 2013-04-24 NOTE — Progress Notes (Signed)
Adult Psychoeducational Group Note  Date:  04/24/2013 Time:  11:39 AM  Group Topic/Focus:  Overcoming Stress:   The focus of this group is to define stress and help patients assess their triggers.  Participation Level:  Active  Participation Quality:  Monopolizing and Redirectable  Affect:  Appropriate  Cognitive:  Alert and Appropriate  Insight: Appropriate and Good  Engagement in Group:  Engaged and Supportive  Modes of Intervention:  Discussion, Education, Socialization and Support  Additional Comments:  Pt spoke freely in group and at times talked when others were talking. She does want to implement "being more carefree" when she goes home.  Reynolds BowlClement, Shevaun Lovan D 04/24/2013, 11:39 AM

## 2013-04-24 NOTE — Progress Notes (Signed)
Patient ID: [name], female   DOB: 04/18/1990, 23 y.o.   MRN: [id number]  Pt. Denies SI/HI and A/V hallucinations. Belongings returned to patient at time of discharge. Patient reports pain in hip from chronic pain but did not request any pain medication. Discharge instructions and medications were reviewed with patient. Patient verbalized understanding of both medications and discharge instructions. Patient stated that she could not wait for her prescriptions. They will be faxed to patient's pharmacy. Q15 minute safety checks were maintained until discharge. Patient did not have any s/s of distress upon discharge.

## 2013-04-24 NOTE — Progress Notes (Signed)
Patient ID: Whitney Simpson, female   DOB: 09/01/1990, 23 y.o.   MRN: [id number]  Morning Wellness Group 9:00 A.M.  The focus of this group is to educate the patient on the purpose and policies of crisis stabilization and provide a format to answer questions about their admission.  The group details unit policies and expectations of patients while admitted.  Patient attended group and participated in morning group exercises. Patient reports that after discharge she is going to go to [name] as a goal. Patient states that a leisure activity she enjoys is bubble bath.
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
## 2013-04-29 NOTE — Progress Notes (Signed)
Patient Discharge Instructions:  After Visit Summary (AVS):   Faxed to:  04/29/13 Discharge Summary Note:   Faxed to:  04/29/13 Psychiatric Admission Assessment Note:   Faxed to:  04/29/13 Suicide Risk Assessment - Discharge Assessment:   Faxed to:  04/29/13 Faxed/Sent to the Next Level Care provider:  04/29/13 [facility]axed to [facility] @ [phone] [facility] to [facility] @ [phone]  Jerelene ReddenSheena E [address], 04/29/2013, 4:15 PM

## 2013-12-22 ENCOUNTER — Encounter (HOSPITAL_COMMUNITY): Payer: Self-pay | Admitting: Psychiatry

## 2014-06-06 ENCOUNTER — Encounter (HOSPITAL_COMMUNITY): Payer: Self-pay | Admitting: Emergency Medicine

## 2014-06-06 DIAGNOSIS — W108XXA Fall (on) (from) other stairs and steps, initial encounter: Secondary | ICD-10-CM | POA: Diagnosis not present

## 2014-06-06 DIAGNOSIS — S7001XA Contusion of right hip, initial encounter: Secondary | ICD-10-CM | POA: Insufficient documentation

## 2014-06-06 DIAGNOSIS — Y9301 Activity, walking, marching and hiking: Secondary | ICD-10-CM | POA: Insufficient documentation

## 2014-06-06 DIAGNOSIS — S3992XA Unspecified injury of lower back, initial encounter: Secondary | ICD-10-CM | POA: Diagnosis not present

## 2014-06-06 DIAGNOSIS — Z8781 Personal history of (healed) traumatic fracture: Secondary | ICD-10-CM | POA: Insufficient documentation

## 2014-06-06 DIAGNOSIS — J45909 Unspecified asthma, uncomplicated: Secondary | ICD-10-CM | POA: Insufficient documentation

## 2014-06-06 DIAGNOSIS — Z79899 Other long term (current) drug therapy: Secondary | ICD-10-CM | POA: Diagnosis not present

## 2014-06-06 DIAGNOSIS — Y9289 Other specified places as the place of occurrence of the external cause: Secondary | ICD-10-CM | POA: Insufficient documentation

## 2014-06-06 DIAGNOSIS — Y998 Other external cause status: Secondary | ICD-10-CM | POA: Insufficient documentation

## 2014-06-06 DIAGNOSIS — S79911A Unspecified injury of right hip, initial encounter: Secondary | ICD-10-CM | POA: Diagnosis present

## 2014-06-06 NOTE — ED Notes (Signed)
Pt. tripped and fell from stairs this evening , no LOC / ambulatory , pt. reports right hip pain .

## 2014-06-07 ENCOUNTER — Emergency Department (HOSPITAL_COMMUNITY)
Admission: EM | Admit: 2014-06-07 | Discharge: 2014-06-07 | Disposition: A | Discharge status: Home / Self Care | Payer: Medicaid Other | Attending: Emergency Medicine | Admitting: Emergency Medicine

## 2014-06-07 ENCOUNTER — Emergency Department (HOSPITAL_COMMUNITY): Payer: Medicaid Other

## 2014-06-07 DIAGNOSIS — R52 Pain, unspecified: Secondary | ICD-10-CM

## 2014-06-07 DIAGNOSIS — S7001XA Contusion of right hip, initial encounter: Secondary | ICD-10-CM

## 2014-06-07 DIAGNOSIS — W19XXXA Unspecified fall, initial encounter: Secondary | ICD-10-CM

## 2014-06-07 MED ORDER — IBUPROFEN 400 MG PO TABS
600.0000 mg | ORAL_TABLET | Freq: Once | ORAL | Status: AC
  Administered 2014-06-07: 600 mg via ORAL
  Filled 2014-06-07 (×2): qty 1

## 2014-06-07 MED ORDER — IBUPROFEN 600 MG PO TABS: 600.0000 mg | ORAL_TABLET | Freq: Four times a day (QID) | ORAL | Status: DC | PRN

## 2014-06-07 MED ORDER — HYDROCODONE-ACETAMINOPHEN 5-325 MG PO TABS: 1.0000 | ORAL_TABLET | Freq: Four times a day (QID) | ORAL | Status: DC | PRN

## 2014-06-07 MED ORDER — TRAMADOL HCL 50 MG PO TABS
50.0000 mg | ORAL_TABLET | Freq: Once | ORAL | Status: AC
  Administered 2014-06-07: 50 mg via ORAL
  Filled 2014-06-07: qty 1

## 2014-06-07 NOTE — ED Notes (Signed)
Pt. Only able to ambulate to door. Pt states it hurts too much to walk. Pt. Able to walk independently with out assistance from RN

## 2014-06-07 NOTE — ED Notes (Signed)
Pt. Left with all belongings 

## 2014-06-07 NOTE — Discharge Instructions (Signed)
You had a fall, with hip pain. The Xrays are not showing any fracture. Please use the crutches to ambulate, and you should start getting better with weight bearing overtime. If not improving - please call the Orthopedic doctor.   Contusion A contusion is a deep bruise. Contusions are the result of an injury that caused bleeding under the skin. The contusion may turn blue, purple, or yellow. Minor injuries will give you a painless contusion, but more severe contusions may stay painful and swollen for a few weeks.  CAUSES  A contusion is usually caused by a blow, trauma, or direct force to an area of the body. SYMPTOMS   Swelling and redness of the injured area.  Bruising of the injured area.  Tenderness and soreness of the injured area.  Pain. DIAGNOSIS  The diagnosis can be made by taking a history and physical exam. An X-ray, CT scan, or MRI may be needed to determine if there were any associated injuries, such as fractures. TREATMENT  Specific treatment will depend on what area of the body was injured. In general, the best treatment for a contusion is resting, icing, elevating, and applying cold compresses to the injured area. Over-the-counter medicines may also be recommended for pain control. Ask your caregiver what the best treatment is for your contusion. HOME CARE INSTRUCTIONS   Put ice on the injured area.  Put ice in a plastic bag.  Place a towel between your skin and the bag.  Leave the ice on for 15-20 minutes, 3-4 times a day, or as directed by your health care provider.  Only take over-the-counter or prescription medicines for pain, discomfort, or fever as directed by your caregiver. Your caregiver may recommend avoiding anti-inflammatory medicines (aspirin, ibuprofen, and naproxen) for 48 hours because these medicines may increase bruising.  Rest the injured area.  If possible, elevate the injured area to reduce swelling. SEEK IMMEDIATE MEDICAL CARE IF:   You  have increased bruising or swelling.  You have pain that is getting worse.  Your swelling or pain is not relieved with medicines. MAKE SURE YOU:   Understand these instructions.  Will watch your condition.  Will get help right away if you are not doing well or get worse. Document Released: 11/16/2004 Document Revised: 02/11/2013 Document Reviewed: 12/12/2010 [facility] Patient Information 2015 [place], [place]. This information is not intended to replace advice given to you by your health care provider. Make sure you discuss any questions you have with your health care provider. Contusion A contusion is a deep bruise. Contusions happen when an injury causes bleeding under the skin. Signs of bruising include pain, puffiness (swelling), and discolored skin. The contusion may turn blue, purple, or yellow. HOME CARE   Put ice on the injured area.  Put ice in a plastic bag.  Place a towel between your skin and the bag.  Leave the ice on for 15-20 minutes, 03-04 times a day.  Only take medicine as told by your doctor.  Rest the injured area.  If possible, raise (elevate) the injured area to lessen puffiness. GET HELP RIGHT AWAY IF:   You have more bruising or puffiness.  You have pain that is getting worse.  Your puffiness or pain is not helped by medicine. MAKE SURE YOU:   Understand these instructions.  Will watch your condition.  Will get help right away if you are not doing well or get worse. Document Released: 07/26/2007 Document Revised: 05/01/2011 Document Reviewed: 12/12/2010 Exit[employer] Patient Information 2015  [employer]. This information is not intended to replace advice given to you by your health care provider. Make sure you discuss any questions you have with your health care provider.

## 2014-06-07 NOTE — ED Provider Notes (Signed)
CSN: [id number]     Arrival date & time 06/06/14  2328 History   This chart was scribed for Whitney Kaplan, MD by Freida Busman, ED Scribe. This patient was seen in room B19C/B19C and the patient's care was started 3:30 AM.    Chief Complaint  Patient presents with  . Fall    The history is provided by the patient. No language interpreter was used.     HPI Comments:  Whitney Simpson is a 24 y.o. female who presents to the Emergency Department s/p fall 2230/2300 last night complaining of moderate constant back and right hip pain following the incidence. Pt states she fell backwards from cement porch onto cement stairs . Pt has not ambulated since the fall. She denies LOC. No alleviating factors or associated symptoms noted.  Past Medical History  Diagnosis Date  . No pertinent past medical history   . Pelvic fracture   . Asthma    Past Surgical History  Procedure Laterality Date  . Cesarean section    . Bony pelvis surgery    . Colostomy     Family History  Problem Relation Age of Onset  . Anesthesia problems Neg Hx   . Hypotension Neg Hx   . Malignant hyperthermia Neg Hx   . Pseudochol deficiency Neg Hx   . Diabetes Other   . Hypertension Other    History  Substance Use Topics  . Smoking status: Never Smoker   . Smokeless tobacco: Not on file  . Alcohol Use: No   OB History    Gravida Para Term Preterm AB TAB SAB Ectopic Multiple Living   0 0 0 0 0 0 2     Review of Systems  Respiratory: Negative for shortness of breath.   Cardiovascular: Negative for chest pain.  Gastrointestinal: Negative for abdominal pain.  Genitourinary: Negative for dysuria.  Musculoskeletal: Positive for myalgias, back pain and arthralgias.       Right hip pain  Hematological: Does not bruise/bleed easily.      Allergies  Review of patient's allergies indicates no known allergies.  Home Medications   Prior to Admission medications   Medication Sig Start Date End Date  Taking? Authorizing Provider  butalbital-acetaminophen-caffeine (FIORICET, ESGIC) 50-325-40 MG per tablet Take 1 tablet by mouth every 6 (six) hours as needed for migraine. 04/24/13  Yes Beau Fanny, FNP  traZODone (DESYREL) 50 MG tablet Take 1 tablet (50 mg total) by mouth at bedtime as needed (insomnia). 04/24/13  Yes Beau Fanny, FNP  HYDROcodone-acetaminophen (NORCO/VICODIN) 5-325 MG per tablet Take 1 tablet by mouth every 6 (six) hours as needed. 06/07/14   Whitney Kaplan, MD  hydrOXYzine (ATARAX/VISTARIL) 25 MG tablet Take 1 tablet (25 mg total) by mouth every 6 (six) hours as needed for anxiety. Patient not taking: Reported on 06/07/2014 04/24/13   Beau Fanny, FNP  ibuprofen (ADVIL,MOTRIN) 600 MG tablet Take 1 tablet (600 mg total) by mouth every 6 (six) hours as needed. 06/07/14   Whitney Kaplan, MD  sertraline (ZOLOFT) 50 MG tablet Take 1 tablet (50 mg total) by mouth daily. Patient not taking: Reported on 06/07/2014 04/24/13   Beau Fanny, FNP   BP 94/50 mmHg  Pulse 63  Temp(Src) 98 F (36.7 C) (Oral)  Resp 20  SpO2 97%  LMP 05/29/2014 Physical Exam  Constitutional: She is oriented to person, place, and time. She appears well-developed and well-nourished.  HENT:  Head: Normocephalic and atraumatic.  Eyes:  Conjunctivae are normal.  Cardiovascular: Normal rate and intact distal pulses.   Pulses:      Dorsalis pedis pulses are 2+ on the right side, and 2+ on the left side.  Pulmonary/Chest: Effort normal.  Abdominal: She exhibits no distension.  Musculoskeletal: She exhibits tenderness.  No midline spine TTP at any level  No stepoffs Posterior right pelvic pain and pain over anterior superior iliac spine  Positive right femur TTP No gross deformity appreciated  Internal and external rotation of the hip is normal    Neurological: She is alert and oriented to person, place, and time.  Skin: Skin is warm and dry.  Psychiatric: She has a normal mood and affect.  Nursing  note and vitals reviewed.   ED Course  Procedures   DIAGNOSTIC STUDIES:  Oxygen Saturation is 100% on RA, normal by my interpretation.    COORDINATION OF CARE:  3:36 AM Pt updated with XR results. Will order pain meds. Discussed treatment plan with pt at bedside and pt agreed to plan.  Labs Review  Labs Reviewed  POC URINE PREG, ED    Imaging Review Dg Hip Unilat With Pelvis 2-3 Views Right  06/07/2014   CLINICAL DATA:  Whitney Simpson backwards down steps at house tonight, RIGHT hip and knee pain. History of arthritis and RIGHT hip surgery 2013.  EXAM: RIGHT HIP (WITH PELVIS) 2-3 VIEWS  COMPARISON:  CT of the pelvis December 04, 2011  FINDINGS: LEFT iliac wing ORIF and vascular coil embolization. Remote superior and inferior pubic rami fractures without acute fracture deformity. No dislocation. Severe RIGHT hip joint space narrowing, periarticular sclerosis and marginal spurring. No destructive bony lesions.  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  Remote pelvic fractures.   Electronically Signed   By: Whitney Metroourtnay  Simpson   On: 06/07/2014 00:42     EKG Interpretation None     @5  am, patient ambulated, with limping. Xray results discussed. Will give crutches. Suspect contusion, but occult fracture is also possible. Pt is able to bear weight, so we have given her ortho info to f/u if not getting better and some pain meds.   MDM   Final diagnoses:  Contusion, hip, right, initial encounter    I personally performed the services described in this documentation, which was scribed in my presence. The recorded information has been reviewed and is accurate.   Pt with fall from porch onto stairs. Pain in the R hip. Internal and external hip rotations is normal. Xrays are neg for acute process. Pt able to ambulate. There is no midline spine pain and no concerns for spine fractures clinically.   Whitney KaplanAnkit Stephone Gum, MD 06/07/14 [phone]

## 2014-08-18 ENCOUNTER — Emergency Department (HOSPITAL_COMMUNITY)
Admission: EM | Admit: 2014-08-18 | Discharge: 2014-08-18 | Disposition: A | Discharge status: Home / Self Care | Payer: Medicaid Other | Attending: Emergency Medicine | Admitting: Emergency Medicine

## 2014-08-18 ENCOUNTER — Encounter (HOSPITAL_COMMUNITY): Payer: Self-pay | Admitting: Emergency Medicine

## 2014-08-18 DIAGNOSIS — K088 Other specified disorders of teeth and supporting structures: Secondary | ICD-10-CM | POA: Diagnosis present

## 2014-08-18 DIAGNOSIS — K029 Dental caries, unspecified: Secondary | ICD-10-CM

## 2014-08-18 DIAGNOSIS — J45909 Unspecified asthma, uncomplicated: Secondary | ICD-10-CM | POA: Insufficient documentation

## 2014-08-18 DIAGNOSIS — Z8781 Personal history of (healed) traumatic fracture: Secondary | ICD-10-CM | POA: Insufficient documentation

## 2014-08-18 MED ORDER — NAPROXEN 500 MG PO TABS: 500.0000 mg | ORAL_TABLET | Freq: Two times a day (BID) | ORAL | Status: DC

## 2014-08-18 MED ORDER — OXYCODONE-ACETAMINOPHEN 5-325 MG PO TABS
ORAL_TABLET | ORAL | Status: AC
  Filled 2014-08-18: qty 1

## 2014-08-18 MED ORDER — BUPIVACAINE-EPINEPHRINE (PF) 0.25% -1:200000 IJ SOLN
10.0000 mL | Freq: Once | INTRAMUSCULAR | Status: DC
  Filled 2014-08-18: qty 10

## 2014-08-18 MED ORDER — IBUPROFEN 800 MG PO TABS
800.0000 mg | ORAL_TABLET | Freq: Once | ORAL | Status: AC
  Administered 2014-08-18: 800 mg via ORAL
  Filled 2014-08-18: qty 1

## 2014-08-18 MED ORDER — AMOXICILLIN 500 MG PO CAPS: 1000.0000 mg | ORAL_CAPSULE | Freq: Two times a day (BID) | ORAL | Status: DC

## 2014-08-18 MED ORDER — BUPIVACAINE-EPINEPHRINE (PF) 0.5% -1:200000 IJ SOLN
10.0000 mL | Freq: Once | INTRAMUSCULAR | Status: AC
  Administered 2014-08-18: 10 mL
  Filled 2014-08-18: qty 10

## 2014-08-18 MED ORDER — OXYCODONE-ACETAMINOPHEN 5-325 MG PO TABS
1.0000 | ORAL_TABLET | Freq: Once | ORAL | Status: AC
  Administered 2014-08-18: 1 via ORAL

## 2014-08-18 MED ORDER — AMOXICILLIN 500 MG PO CAPS
1000.0000 mg | ORAL_CAPSULE | Freq: Once | ORAL | Status: AC
  Administered 2014-08-18: 1000 mg via ORAL
  Filled 2014-08-18: qty 2

## 2014-08-18 MED ORDER — BUPIVACAINE-EPINEPHRINE (PF) 0.5% -1:200000 IJ SOLN: 10.0000 mL | Freq: Once | INTRAMUSCULAR | Status: DC

## 2014-08-18 NOTE — ED Provider Notes (Signed)
CSN: [id number]     Arrival date & time 08/18/14  0033 History   First MD Initiated Contact with Patient 08/18/14 [phone]     Chief Complaint  Patient presents with  . Dental Pain     (Consider location/radiation/quality/duration/timing/severity/associated sxs/prior Treatment) Patient is a 24 y.o. female presenting with tooth pain. The history is provided by the patient.  Dental Pain She complains of pain in a left upper molar which has been present for the last 2 months but has been getting worse. Pain is severe and she rates at 10/10. it is worse with chewing. She takes hydrocodone have acetaminophen for arthritis and it is not been helping. She denies fever or chills. She has not tried any other treatments.  Past Medical History  Diagnosis Date  . No pertinent past medical history   . Pelvic fracture   . Asthma    Past Surgical History  Procedure Laterality Date  . Cesarean section    . Bony pelvis surgery    . Colostomy     Family History  Problem Relation Age of Onset  . Anesthesia problems Neg Hx   . Hypotension Neg Hx   . Malignant hyperthermia Neg Hx   . Pseudochol deficiency Neg Hx   . Diabetes Other   . Hypertension Other    History  Substance Use Topics  . Smoking status: Never Smoker   . Smokeless tobacco: Not on file  . Alcohol Use: No   OB History    Gravida Para Term Preterm AB TAB SAB Ectopic Multiple Living   2 2 2  0 0 0 0 0 0 2     Review of Systems  All other systems reviewed and are negative.     Allergies  Review of patient's allergies indicates no known allergies.  Home Medications   Prior to Admission medications   Medication Sig Start Date End Date Taking? Authorizing Provider  butalbital-acetaminophen-caffeine (FIORICET, ESGIC) 50-325-40 MG per tablet Take 1 tablet by mouth every 6 (six) hours as needed for migraine. 04/24/13   Beau FannyJohn C Withrow, FNP  HYDROcodone-acetaminophen (NORCO/VICODIN) 5-325 MG per tablet Take 1 tablet by mouth every  6 (six) hours as needed. 06/07/14   Derwood KaplanAnkit Nanavati, MD  hydrOXYzine (ATARAX/VISTARIL) 25 MG tablet Take 1 tablet (25 mg total) by mouth every 6 (six) hours as needed for anxiety. Patient not taking: Reported on 06/07/2014 04/24/13   Beau FannyJohn C Withrow, FNP  ibuprofen (ADVIL,MOTRIN) 600 MG tablet Take 1 tablet (600 mg total) by mouth every 6 (six) hours as needed. 06/07/14   Derwood KaplanAnkit Nanavati, MD  sertraline (ZOLOFT) 50 MG tablet Take 1 tablet (50 mg total) by mouth daily. Patient not taking: Reported on 06/07/2014 04/24/13   Beau FannyJohn C Withrow, FNP  traZODone (DESYREL) 50 MG tablet Take 1 tablet (50 mg total) by mouth at bedtime as needed (insomnia). 04/24/13   Beau FannyJohn C Withrow, FNP   BP 114/58 mmHg  Pulse 76  Temp(Src) 98.3 F (36.8 C) (Oral)  Resp 20  Wt 185 lb 1 oz (83.944 kg)  SpO2 98%  LMP 07/22/2014 Physical Exam  Nursing note and vitals reviewed.  24 year old female, resting comfortably and in no acute distress. Vital signs are  normal. Oxygen saturation is 98%, which is normal. Head is normocephalic and atraumatic. PERRLA, EOMI. Oropharynx is clear. dental caries are seen on 2 number of 15 with marked tenderness. There is no gingival pallor or erythema or swelling or tenderness.  Neck is nontender and supple  without adenopathy or JVD. Back is nontender and there is no CVA tenderness. Lungs are clear without rales, wheezes, or rhonchi. Chest is nontender. Heart has regular rate and rhythm without murmur. Abdomen is soft, flat, nontender without masses or hepatosplenomegaly and peristalsis is normoactive. Extremities have no cyanosis or edema, full range of motion is present. Skin is warm and dry without rash. Neurologic: Mental status is normal, cranial nerves are intact, there are no motor or sensory deficits.  ED Course  Procedures (including critical care time) Procedure: Dental block Indication: Dental pain Prior to the procedure, a timeout was performed Tooth #15 had injection of bupivacaine  0.5% with epinephrine in the region of the root of. Total amount injected 5 mL. Patient tolerated procedure well with good anesthesia obtained.  MDM   Final diagnoses:  Pain due to dental caries    Dental pain from dental caries. I have discussed with the patient that she is already on very strong pain medication. Decision is made to give a dental block and she will be referred to orthopedics. She is also given a prescription for amoxicillin. Will add NSAIDs to her regimen and she will be given a prescription for naproxen.    [name], MD 08/18/14 [phone]

## 2014-08-18 NOTE — Discharge Instructions (Signed)
Dental Pain A tooth ache may be caused by cavities (tooth decay). Cavities expose the nerve of the tooth to air and hot or cold temperatures. It may come from an infection or abscess (also called a boil or furuncle) around your tooth. It is also often caused by dental caries (tooth decay). This causes the pain you are having. DIAGNOSIS  Your caregiver can diagnose this problem by exam. TREATMENT   If caused by an infection, it may be treated with medications which kill germs (antibiotics) and pain medications as prescribed by your caregiver. Take medications as directed.  Only take over-the-counter or prescription medicines for pain, discomfort, or fever as directed by your caregiver.  Whether the tooth ache today is caused by infection or dental disease, you should see your dentist as soon as possible for further care. SEEK MEDICAL CARE IF: The exam and treatment you received today has been provided on an emergency basis only. This is not a substitute for complete medical or dental care. If your problem worsens or new problems (symptoms) appear, and you are unable to meet with your dentist, call or return to this location. SEEK IMMEDIATE MEDICAL CARE IF:   You have a fever.  You develop redness and swelling of your face, jaw, or neck.  You are unable to open your mouth.  You have severe pain uncontrolled by pain medicine. MAKE SURE YOU:   Understand these instructions.  Will watch your condition.  Will get help right away if you are not doing well or get worse. Document Released: 02/06/2005 Document Revised: 05/01/2011 Document Reviewed: 09/25/2007 [facility] Patient Information 2015 [place], [place]. This information is not intended to replace advice given to you by your health care provider. Make sure you discuss any questions you have with your health care provider.  Amoxicillin capsules or tablets What is this medicine? AMOXICILLIN (a mox i SIL in) is a penicillin antibiotic. It  is used to treat certain kinds of bacterial infections. It will not work for colds, flu, or other viral infections. This medicine may be used for other purposes; ask your health care provider or pharmacist if you have questions. COMMON BRAND NAME(S): Amoxil, Moxilin, Sumox, Trimox What should I tell my health care provider before I take this medicine? They need to know if you have any of these conditions: -asthma -kidney disease -an unusual or allergic reaction to amoxicillin, other penicillins, cephalosporin antibiotics, other medicines, foods, dyes, or preservatives -pregnant or trying to get pregnant -breast-feeding How should I use this medicine? Take this medicine by mouth with a glass of water. Follow the directions on your prescription label. You may take this medicine with food or on an empty stomach. Take your medicine at regular intervals. Do not take your medicine more often than directed. Take all of your medicine as directed even if you think your are better. Do not skip doses or stop your medicine early. Talk to your pediatrician regarding the use of this medicine in children. While this drug may be prescribed for selected conditions, precautions do apply. Overdosage: If you think you have taken too much of this medicine contact a poison control center or emergency room at once. NOTE: This medicine is only for you. Do not share this medicine with others. What if I miss a dose? If you miss a dose, take it as soon as you can. If it is almost time for your next dose, take only that dose. Do not take double or extra doses. What may interact  with this medicine? -amiloride -birth control pills -chloramphenicol -macrolides -probenecid -sulfonamides -tetracyclines This list may not describe all possible interactions. Give your health care provider a list of all the medicines, herbs, non-prescription drugs, or dietary supplements you use. Also tell them if you smoke, drink alcohol, or  use illegal drugs. Some items may interact with your medicine. What should I watch for while using this medicine? Tell your doctor or health care professional if your symptoms do not improve in 2 or 3 days. Take all of the doses of your medicine as directed. Do not skip doses or stop your medicine early. If you are diabetic, you may get a false positive result for sugar in your urine with certain brands of urine tests. Check with your doctor. Do not treat diarrhea with over-the-counter products. Contact your doctor if you have diarrhea that lasts more than 2 days or if the diarrhea is severe and watery. What side effects may I notice from receiving this medicine? Side effects that you should report to your doctor or health care professional as soon as possible: -allergic reactions like skin rash, itching or hives, swelling of the face, lips, or tongue -breathing problems -dark urine -redness, blistering, peeling or loosening of the skin, including inside the mouth -seizures -severe or watery diarrhea -trouble passing urine or change in the amount of urine -unusual bleeding or bruising -unusually weak or tired -yellowing of the eyes or skin Side effects that usually do not require medical attention (report to your doctor or health care professional if they continue or are bothersome): -dizziness -headache -stomach upset -trouble sleeping This list may not describe all possible side effects. Call your doctor for medical advice about side effects. You may report side effects to FDA at 1-800-FDA-1088. Where should I keep my medicine? Keep out of the reach of children. Store between 68 and 77 degrees F (20 and 25 degrees C). Keep bottle closed tightly. Throw away any unused medicine after the expiration date. NOTE: This sheet is a summary. It may not cover all possible information. If you have questions about this medicine, talk to your doctor, pharmacist, or health care provider.  2015,  Elsevier/Gold Standard. (2007-04-30 14:10:59)  Naproxen and naproxen sodium oral immediate-release tablets What is this medicine? NAPROXEN (na PROX en) is a non-steroidal anti-inflammatory drug (NSAID). It is used to reduce swelling and to treat pain. This medicine may be used for dental pain, headache, or painful monthly periods. It is also used for painful joint and muscular problems such as arthritis, tendinitis, bursitis, and gout. This medicine may be used for other purposes; ask your health care provider or pharmacist if you have questions. COMMON BRAND NAME(S): Aflaxen, Aleve, Aleve Arthritis, All Day Relief, Anaprox, Anaprox DS, Naprosyn What should I tell my health care provider before I take this medicine? They need to know if you have any of these conditions: -asthma -cigarette smoker -drink more than 3 alcohol containing drinks a day -heart disease or circulation problems such as heart failure or leg edema (fluid retention) -high blood pressure -kidney disease -liver disease -stomach bleeding or ulcers -an unusual or allergic reaction to naproxen, aspirin, other NSAIDs, other medicines, foods, dyes, or preservatives -pregnant or trying to get pregnant -breast-feeding How should I use this medicine? Take this medicine by mouth with a glass of water. Follow the directions on the prescription label. Take it with food if your stomach gets upset. Try to not lie down for at least 10 minutes after you take  it. Take your medicine at regular intervals. Do not take your medicine more often than directed. Long-term, continuous use may increase the risk of heart attack or stroke. A special MedGuide will be given to you by the pharmacist with each prescription and refill. Be sure to read this information carefully each time. Talk to your pediatrician regarding the use of this medicine in children. Special care may be needed. Overdosage: If you think you have taken too much of this medicine  contact a poison control center or emergency room at once. NOTE: This medicine is only for you. Do not share this medicine with others. What if I miss a dose? If you miss a dose, take it as soon as you can. If it is almost time for your next dose, take only that dose. Do not take double or extra doses. What may interact with this medicine? -alcohol -aspirin -cidofovir -diuretics -lithium -methotrexate -other drugs for inflammation like ketorolac or prednisone -pemetrexed -probenecid -warfarin This list may not describe all possible interactions. Give your health care provider a list of all the medicines, herbs, non-prescription drugs, or dietary supplements you use. Also tell them if you smoke, drink alcohol, or use illegal drugs. Some items may interact with your medicine. What should I watch for while using this medicine? Tell your doctor or health care professional if your pain does not get better. Talk to your doctor before taking another medicine for pain. Do not treat yourself. This medicine does not prevent heart attack or stroke. In fact, this medicine may increase the chance of a heart attack or stroke. The chance may increase with longer use of this medicine and in people who have heart disease. If you take aspirin to prevent heart attack or stroke, talk with your doctor or health care professional. Do not take other medicines that contain aspirin, ibuprofen, or naproxen with this medicine. Side effects such as stomach upset, nausea, or ulcers may be more likely to occur. Many medicines available without a prescription should not be taken with this medicine. This medicine can cause ulcers and bleeding in the stomach and intestines at any time during treatment. Do not smoke cigarettes or drink alcohol. These increase irritation to your stomach and can make it more susceptible to damage from this medicine. Ulcers and bleeding can happen without warning symptoms and can cause death. You  may get drowsy or dizzy. Do not drive, use machinery, or do anything that needs mental alertness until you know how this medicine affects you. Do not stand or sit up quickly, especially if you are an older patient. This reduces the risk of dizzy or fainting spells. This medicine can cause you to bleed more easily. Try to avoid damage to your teeth and gums when you brush or floss your teeth. What side effects may I notice from receiving this medicine? Side effects that you should report to your doctor or health care professional as soon as possible: -black or bloody stools, blood in the urine or vomit -blurred vision -chest pain -difficulty breathing or wheezing -nausea or vomiting -severe stomach pain -skin rash, skin redness, blistering or peeling skin, hives, or itching -slurred speech or weakness on one side of the body -swelling of eyelids, throat, lips -unexplained weight gain or swelling -unusually weak or tired -yellowing of eyes or skin Side effects that usually do not require medical attention (report to your doctor or health care professional if they continue or are bothersome): -constipation -headache -heartburn This list may not  describe all possible side effects. Call your doctor for medical advice about side effects. You may report side effects to FDA at 1-800-FDA-1088. Where should I keep my medicine? Keep out of the reach of children. Store at room temperature between 15 and 30 degrees C (59 and 86 degrees F). Keep container tightly closed. Throw away any unused medicine after the expiration date. NOTE: This sheet is a summary. It may not cover all possible information. If you have questions about this medicine, talk to your doctor, pharmacist, or health care provider.  2015, Elsevier/Gold Standard. (2009-02-08 20:10:16)   Emergency Department Resource Guide  Dental Care: Organization         Address  Phone  Notes  [facility] Department of [facility] [address] [place], [place] [phone] Accepts children up to age 81 who are enrolled in [place] or [place] Health Choice; pregnant women with a Medicaid card; and children who have applied for Medicaid or [place] Health Choice, but were declined, whose parents can pay a reduced fee at time of service.  [facility] Department of [facility]  [address], [place] [phone] Accepts children up to age 59 who are enrolled in [place] or [place] Health Choice; pregnant women with a Medicaid card; and children who have applied for Medicaid or [place] Health Choice, but were declined, whose parents can pay a reduced fee at time of service.  [facility]  [address] [place], [place] [phone] Patients are seen by appointment only. Walk-ins are not accepted. [facility] will see patients 19 years of age and older. Monday - Tuesday (8am-5pm) Most Wednesdays (8:30-5pm) $30 per visit, cash only  [facility]  [address], [facility] [phone] Patients are seen by appointment only. Walk-ins are not accepted. [facility] will see patients 28 years of age and older. One Wednesday Evening (Monthly: Volunteer Based).  $30 per visit, cash only  [employer]  [phone] for adults; Children under age 26, call Graduate Pediatric Dentistry at [phone]. Children aged 39-14, please call [phone] to request a pediatric application.  Dental services are provided in all areas of dental care including fillings, crowns and bridges, complete and partial dentures, implants, gum treatment, root canals, and extractions. Preventive care is also provided. Treatment is provided to both adults and children. Patients are selected via a lottery and there is often a waiting list.   [facility] [address] [place]  [phone] [email or web address]   [facility]  [address] [place], [place] [phone], Ext. [phone] Second and Fourth Thursday of each month, opens at 6:30 AM; Clinic ends at 9 AM.  Patients are seen on a first-come first-served basis, and a limited number are seen during each clinic.   [facility]  [address] [place], [place] [phone]   Eligibility Requirements You must have lived in [place], [place], or [place] counties for at least the last three months.   You cannot be eligible for state or federal sponsored [place], including [employer], [place], or [employer].   You generally cannot be eligible for healthcare insurance through your employer.    How to apply: Eligibility screenings are held every Tuesday and Wednesday afternoon from 1:00 pm until 4:00 pm. You do not need an appointment for the interview!  [facility] [phone]  [place], [place], [place] [phone]   [facility]  [phone]   [facility]  [phone]   [facility]  [phone]

## 2014-08-18 NOTE — ED Notes (Signed)
Pt. reports progressing left upper molar pain onset this week .

## 2019-04-28 ENCOUNTER — Inpatient Hospital Stay (HOSPITAL_COMMUNITY)
Admission: AD | Admit: 2019-04-28 | Discharge: 2019-04-28 | Disposition: A | Discharge status: Other Acute Inpatient Hospital | Payer: Medicaid Other | Attending: Emergency Medicine | Admitting: Emergency Medicine

## 2019-04-28 ENCOUNTER — Other Ambulatory Visit: Payer: Self-pay

## 2019-04-28 ENCOUNTER — Inpatient Hospital Stay (HOSPITAL_COMMUNITY): Payer: Medicaid Other

## 2019-04-28 ENCOUNTER — Encounter (HOSPITAL_COMMUNITY): Payer: Self-pay | Admitting: Obstetrics & Gynecology

## 2019-04-28 DIAGNOSIS — O468X1 Other antepartum hemorrhage, first trimester: Secondary | ICD-10-CM

## 2019-04-28 DIAGNOSIS — O209 Hemorrhage in early pregnancy, unspecified: Secondary | ICD-10-CM

## 2019-04-28 DIAGNOSIS — Z3490 Encounter for supervision of normal pregnancy, unspecified, unspecified trimester: Secondary | ICD-10-CM

## 2019-04-28 DIAGNOSIS — O208 Other hemorrhage in early pregnancy: Secondary | ICD-10-CM | POA: Diagnosis not present

## 2019-04-28 DIAGNOSIS — Z3A12 12 weeks gestation of pregnancy: Secondary | ICD-10-CM

## 2019-04-28 DIAGNOSIS — O418X10 Other specified disorders of amniotic fluid and membranes, first trimester, not applicable or unspecified: Secondary | ICD-10-CM

## 2019-04-28 HISTORY — DX: Unspecified osteoarthritis, unspecified site: M19.90

## 2019-04-28 LAB — WET PREP, GENITAL
Sperm: NONE SEEN
Trich, Wet Prep: NONE SEEN
Yeast Wet Prep HPF POC: NONE SEEN

## 2019-04-28 LAB — GC/CHLAMYDIA PROBE AMP (~~LOC~~) NOT AT ARMC
Chlamydia: NEGATIVE
Comment: NEGATIVE
Comment: NORMAL
Neisseria Gonorrhea: NEGATIVE

## 2019-04-28 LAB — URINALYSIS, ROUTINE W REFLEX MICROSCOPIC
Bilirubin Urine: NEGATIVE
Glucose, UA: NEGATIVE mg/dL
Ketones, ur: 5 mg/dL — AB
Nitrite: POSITIVE — AB
Protein, ur: 100 mg/dL — AB
Specific Gravity, Urine: 1.024 (ref 1.005–1.030)
pH: 6 (ref 5.0–8.0)

## 2019-04-28 LAB — CBC
HCT: 37.1 % (ref 36.0–46.0)
Hemoglobin: 12.5 g/dL (ref 12.0–15.0)
MCH: 32.1 pg (ref 26.0–34.0)
MCHC: 33.7 g/dL (ref 30.0–36.0)
MCV: 95.1 fL (ref 80.0–100.0)
Platelets: 186 10*3/uL (ref 150–400)
RBC: 3.9 MIL/uL (ref 3.87–5.11)
RDW: 12.3 % (ref 11.5–15.5)
WBC: 10.1 10*3/uL (ref 4.0–10.5)
nRBC: 0 % (ref 0.0–0.2)

## 2019-04-28 LAB — HCG, QUANTITATIVE, PREGNANCY: hCG, Beta Chain, Quant, S: 55521 m[IU]/mL — ABNORMAL HIGH (ref <5)

## 2019-04-28 LAB — I-STAT BETA HCG BLOOD, ED (MC, WL, AP ONLY): I-stat hCG, quantitative: >2000 m[IU]/mL — ABNORMAL HIGH (ref <5)

## 2019-04-28 MED ORDER — PROMETHAZINE HCL 25 MG PO TABS: 25.0000 mg | ORAL_TABLET | Freq: Four times a day (QID) | ORAL | 0 refills | Status: DC | PRN

## 2019-04-28 MED ORDER — PREPLUS 27-1 MG PO TABS: 1.0000 | ORAL_TABLET | Freq: Every day | ORAL | 13 refills | Status: DC

## 2019-04-28 MED ORDER — ACETAMINOPHEN 325 MG PO TABS
650.0000 mg | ORAL_TABLET | Freq: Once | ORAL | Status: AC
  Administered 2019-04-28: 650 mg via ORAL
  Filled 2019-04-28: qty 2

## 2019-04-28 MED ORDER — ONDANSETRON 4 MG PO TBDP
4.0000 mg | ORAL_TABLET | Freq: Once | ORAL | Status: AC
  Administered 2019-04-28: 4 mg via ORAL
  Filled 2019-04-28: qty 1

## 2019-04-28 NOTE — MAU Note (Signed)
Vaginal bleeding 30 min before arriving to hospital, laying in bed,  blood was down legs and saw 2 blood clots - dime size. Last intercourse in February.  Took home pregnancy test was positive this morning.

## 2019-04-28 NOTE — ED Notes (Signed)
Pt assessed by MD in triage and transported to MAU

## 2019-04-28 NOTE — Discharge Instructions (Signed)
Subchorionic Hematoma  A subchorionic hematoma is a gathering of blood between the outer wall of the embryo (chorion) and the inner wall of the womb (uterus). This condition can cause vaginal bleeding. If they cause little or no vaginal bleeding, early small hematomas usually shrink on their own and do not affect your baby or pregnancy. When bleeding starts later in pregnancy, or if the hematoma is larger or occurs in older pregnant women, the condition may be more serious. Larger hematomas may get bigger, which increases the chances of miscarriage. This condition also increases the risk of:  Premature separation of the placenta from the uterus.  Premature (preterm) labor.  Stillbirth. What are the causes? The exact cause of this condition is not known. It occurs when blood is trapped between the placenta and the uterine wall because the placenta has separated from the original site of implantation. What increases the risk? You are more likely to develop this condition if:  You were treated with fertility medicines.  You conceived through in vitro fertilization (IVF). What are the signs or symptoms? Symptoms of this condition include:  Vaginal spotting or bleeding.  Contractions of the uterus. These cause abdominal pain. Sometimes you may have no symptoms and the bleeding may only be seen when ultrasound images are taken (transvaginal ultrasound). How is this diagnosed? This condition is diagnosed based on a physical exam. This includes a pelvic exam. You may also have other tests, including:  Blood tests.  Urine tests.  Ultrasound of the abdomen. How is this treated? Treatment for this condition can vary. Treatment may include:  Watchful waiting. You will be monitored closely for any changes in bleeding. During this stage: ? The hematoma may be reabsorbed by the body. ? The hematoma may separate the fluid-filled space containing the embryo (gestational sac) from the wall of the  womb (endometrium).  Medicines.  Activity restriction. This may be needed until the bleeding stops. Follow these instructions at home:  Stay on bed rest if told to do so by your health care provider.  Do not lift anything that is heavier than 10 lbs. (4.5 kg) or as told by your health care provider.  Do not use any products that contain nicotine or tobacco, such as cigarettes and e-cigarettes. If you need help quitting, ask your health care provider.  Track and write down the number of pads you use each day and how soaked (saturated) they are.  Do not use tampons.  Keep all follow-up visits as told by your health care provider. This is important. Your health care provider may ask you to have follow-up blood tests or ultrasound tests or both. Contact a health care provider if:  You have any vaginal bleeding.  You have a fever. Get help right away if:  You have severe cramps in your stomach, back, abdomen, or pelvis.  You pass large clots or tissue. Save any tissue for your health care provider to look at.  You have more vaginal bleeding, and you faint or become lightheaded or weak. Summary  A subchorionic hematoma is a gathering of blood between the outer wall of the placenta and the uterus.  This condition can cause vaginal bleeding.  Sometimes you may have no symptoms and the bleeding may only be seen when ultrasound images are taken.  Treatment may include watchful waiting, medicines, or activity restriction. This information is not intended to replace advice given to you by your health care provider. Make sure you discuss any questions you   have with your health care provider. Document Revised: 01/19/2017 Document Reviewed: 04/04/2016 Elsevier Patient Education  2020 [employer].  Safe Medications in Pregnancy   Acne: Benzoyl Peroxide Salicylic Acid  Backache/Headache: Tylenol: 2 regular strength every 4 hours OR              2 Extra strength every 6  hours  Colds/Coughs/Allergies: Benadryl (alcohol free) 25 mg every 6 hours as needed Breath right strips Claritin Cepacol throat lozenges Chloraseptic throat spray Cold-Eeze- up to three times per day Cough drops, alcohol free Flonase (by prescription only) Guaifenesin Mucinex Robitussin DM (plain only, alcohol free) Saline nasal spray/drops Sudafed (pseudoephedrine) & Actifed ** use only after [redacted] weeks gestation and if you do not have high blood pressure Tylenol Vicks Vaporub Zinc lozenges Zyrtec   Constipation: Colace Ducolax suppositories Fleet enema Glycerin suppositories Metamucil Milk of magnesia Miralax Senokot Smooth move tea  Diarrhea: Kaopectate Imodium A-D  *NO pepto Bismol  Hemorrhoids: Anusol Anusol HC Preparation H Tucks  Indigestion: Tums Maalox Mylanta Zantac  Pepcid  Insomnia: Benadryl (alcohol free) 25mg  every 6 hours as needed Tylenol PM Unisom, no Gelcaps  Leg Cramps: Tums MagGel  Nausea/Vomiting:  Bonine Dramamine Emetrol Ginger extract Sea bands Meclizine  Nausea medication to take during pregnancy:  Unisom (doxylamine succinate 25 mg tablets) Take one tablet daily at bedtime. If symptoms are not adequately controlled, the dose can be increased to a maximum recommended dose of two tablets daily (1/2 tablet in the morning, 1/2 tablet mid-afternoon and one at bedtime). Vitamin B6 100mg  tablets. Take one tablet twice a day (up to 200 mg per day).  Skin Rashes: Aveeno products Benadryl cream or 25mg  every 6 hours as needed Calamine Lotion 1% cortisone cream  Yeast infection: Gyne-lotrimin 7 Monistat 7   **If taking multiple medications, please check labels to avoid duplicating the same active ingredients **take medication as directed on the label ** Do not exceed 4000 mg of tylenol in 24 hours **Do not take medications that contain aspirin or ibuprofen   Prenatal Care Providers           [facility]  Healthcare @ [facility]   Phone: [phone]  [facility] for [facility] Healthcare @ [facility]   Phone: [phone]  [facility] @[place]       Phone: [phone]            [facility] for [facility] Healthcare @ [place]     Phone: [phone]          [facility] for [facility] Healthcare @ [facility]   Phone: [phone]  [facility] for [facility] @ [facility]  Phone: [phone]  [facility] for [facility] @ [facility] Phone: [phone]     [facility]  Phone: [phone]  [facility]  Phone: [phone]  [facility] Phone: [phone]  [facility] Phone: [phone]  [facility] Phone: [phone]  [facility] Phone: [phone]  [facility]  Phone: [phone]

## 2019-04-28 NOTE — ED Provider Notes (Signed)
29 year old female with last menstrual.  Sometime in December started having vaginal bleeding with passing clots this evening.  She did have a positive home pregnancy test.  She is noted to be in no distress with normal vital signs.  She is transferred to maternal admissions unit.   [name], MD 04/28/19 [phone]

## 2019-04-28 NOTE — ED Triage Notes (Signed)
bleeding started about 30 mins ago possible pregnancy 12 to 14 weeks

## 2019-04-28 NOTE — MAU Provider Note (Signed)
History     CSN: [id number]  Arrival date and time: 04/28/19 0123   None     Chief Complaint  Patient presents with  . Vaginal Bleeding   HPI Whitney Simpson is a 29 y.o. G2P2002 at 16 weks by uncertain LMP who presents to MAU with chief complaint of vaginal bleeding. This is a new problem, onset about 0100 this morning. Patient endorses seeing bright red blood running down her legs including two dime-size clots.   Patient also c/o bilateral lower abdominal pain, onset coinciding with onset of vaginal bleeding. Her pain is 5-7/10. She denies aggravating or alleviating factors. She has not taken medication or tried other treatments for this complaint.   Patient denies abdominal tenderness, dysuria, dizziness, weakness,  fever and recent illness. She is remote from intercourse.    OB History    Gravida  3   Para  2   Term  2   Preterm  0   AB  0   Living  2     SAB  0   TAB  0   Ectopic  0   Multiple  0   Live Births  2           Past Medical History:  Diagnosis Date  . Asthma   . No pertinent past medical history   . Pelvic fracture     Past Surgical History:  Procedure Laterality Date  . BONY PELVIS SURGERY    . CESAREAN SECTION    . COLOSTOMY      Family History  Problem Relation Age of Onset  . Anesthesia problems Neg Hx   . Hypotension Neg Hx   . Malignant hyperthermia Neg Hx   . Pseudochol deficiency Neg Hx   . Diabetes Other   . Hypertension Other     Social History   Tobacco Use  . Smoking status: Never Smoker  Substance Use Topics  . Alcohol use: No  . Drug use: Yes    Types: Marijuana    Allergies: No Known Allergies  Medications Prior to Admission  Medication Sig Dispense Refill Last Dose  . amoxicillin (AMOXIL) 500 MG capsule Take 2 capsules (1,000 mg total) by mouth 2 (two) times daily. 40 capsule 0   . butalbital-acetaminophen-caffeine (FIORICET, ESGIC) 50-325-40 MG per tablet Take 1 tablet by mouth every 6 (six)  hours as needed for migraine. 14 tablet 0   . HYDROcodone-acetaminophen (NORCO/VICODIN) 5-325 MG per tablet Take 1 tablet by mouth every 6 (six) hours as needed. 15 tablet 0   . hydrOXYzine (ATARAX/VISTARIL) 25 MG tablet Take 1 tablet (25 mg total) by mouth every 6 (six) hours as needed for anxiety. (Patient not taking: Reported on 06/07/2014) 30 tablet 0   . ibuprofen (ADVIL,MOTRIN) 600 MG tablet Take 1 tablet (600 mg total) by mouth every 6 (six) hours as needed. 30 tablet 0   . naproxen (NAPROSYN) 500 MG tablet Take 1 tablet (500 mg total) by mouth 2 (two) times daily. 30 tablet 0   . sertraline (ZOLOFT) 50 MG tablet Take 1 tablet (50 mg total) by mouth daily. (Patient not taking: Reported on 06/07/2014) 30 tablet 0   . traZODone (DESYREL) 50 MG tablet Take 1 tablet (50 mg total) by mouth at bedtime as needed (insomnia). 14 tablet 0     Review of Systems  Constitutional: Negative for chills, fatigue and fever.  Respiratory: Negative for shortness of breath.   Gastrointestinal: Positive for abdominal pain.  Genitourinary: Positive  for vaginal bleeding. Negative for dysuria and flank pain.  Neurological: Negative for dizziness and weakness.  All other systems reviewed and are negative.  Physical Exam   Blood pressure 124/78, pulse 65, temperature 98.3 F (36.8 C), temperature source Oral, resp. rate 16, SpO2 100 %.  Physical Exam  Nursing note and vitals reviewed. Constitutional: She is oriented to person, place, and time. She appears well-developed and well-nourished.  Cardiovascular: Normal rate and normal heart sounds.  Respiratory: Effort normal and breath sounds normal. No respiratory distress. She has no wheezes. She has no rales.  GI: Soft. There is no abdominal tenderness. There is no CVA tenderness.  Genitourinary:    Uterus normal.     Vaginal discharge present.     Genitourinary Comments: Pelvic exam: External genitalia normal, vaginal walls pink and well rugated, scant blood  tinged mucus noted throughout vaginal vault, removed with fox swab x 1, cervix visually closed, no lesions noted. Bimanual: cervix 0/thick/posterior, neg CMT, uterus nontender, no adnexal tenderness noted.    Neurological: She is alert and oriented to person, place, and time.  Skin: Skin is warm and dry.  Psychiatric: She has a normal mood and affect. Her behavior is normal. Judgment and thought content normal.    MAU Course/MDM  Procedures: speculum exam, ultrasound  --Abnormal UA, urine culture ordered --Clue cells, no other Amel criteria noted on physical exam. Will not treat for Bacterial Vaginosis. --Abdominal pain resolved with Tylenol  Orders Placed This Encounter  Procedures  . Wet prep, genital  . Culture, OB Urine  . US OB Comp Less 14 Wks  . CBC  . hCG, quantitative, pregnancy  . Urinalysis, Routine w reflex microscopic  . I-Stat Beta hCG blood, ED ([facility], [facility], [facility] only)  . Discharge patient   Patient Vitals for the past 24 hrs:  BP Temp Temp src Pulse Resp SpO2 Height Weight  04/28/19 0342 135/75 -- -- (!) 56 -- -- -- --  04/28/19 0222 108/73 98.6 F (37 C) Oral 63 16 98 % 5\' 4"  (1.626 m) 90.3 kg  04/28/19 0133 124/78 98.3 F (36.8 C) Oral 65 16 100 % -- --   Results for orders placed or performed during the hospital encounter of 04/28/19 (from the past 24 hour(s))  I-Stat Beta hCG blood, ED ([facility], [facility], [facility] only)     Status: Abnormal   Collection Time: 04/28/19  1:40 AM  Result Value Ref Range   I-stat hCG, quantitative >2,000.0 (H) <5 mIU/mL   Comment 3          Urinalysis, Routine w reflex microscopic     Status: Abnormal   Collection Time: 04/28/19  2:42 AM  Result Value Ref Range   Color, Urine AMBER (A) YELLOW   APPearance CLOUDY (A) CLEAR   Specific Gravity, Urine 1.024 1.005 - 1.030   pH 6.0 5.0 - 8.0   Glucose, UA NEGATIVE NEGATIVE mg/dL   Hgb urine dipstick LARGE (A) NEGATIVE   Bilirubin Urine NEGATIVE NEGATIVE   Ketones, ur 5 (A) NEGATIVE mg/dL    Protein, ur 100 (A) NEGATIVE mg/dL   Nitrite POSITIVE (A) NEGATIVE   Leukocytes,Ua TRACE (A) NEGATIVE   RBC / HPF 11-20 0 - 5 RBC/hpf   WBC, UA 0-5 0 - 5 WBC/hpf   Bacteria, UA MANY (A) NONE SEEN   Squamous Epithelial / LPF 11-20 0 - 5   Mucus PRESENT   CBC     Status: None   Collection Time: 04/28/19  2:43  AM  Result Value Ref Range   WBC 10.1 4.0 - 10.5 K/uL   RBC 3.90 3.87 - 5.11 MIL/uL   Hemoglobin 12.5 12.0 - 15.0 g/dL   HCT 67.3 41.9 - 37.9 %   MCV 95.1 80.0 - 100.0 fL   MCH 32.1 26.0 - 34.0 pg   MCHC 33.7 30.0 - 36.0 g/dL   RDW 02.4 09.7 - 35.3 %   Platelets 186 150 - 400 K/uL   nRBC 0.0 0.0 - 0.2 %  hCG, quantitative, pregnancy     Status: Abnormal   Collection Time: 04/28/19  2:43 AM  Result Value Ref Range   hCG, Beta Chain, Quant, S 55,521 (H) <5 mIU/mL  Wet prep, genital     Status: Abnormal   Collection Time: 04/28/19  2:56 AM   Specimen: Cervix  Result Value Ref Range   Yeast Wet Prep HPF POC NONE SEEN NONE SEEN   Trich, Wet Prep NONE SEEN NONE SEEN   Clue Cells Wet Prep HPF POC PRESENT (A) NONE SEEN   WBC, Wet Prep HPF POC MANY (A) NONE SEEN   Sperm NONE SEEN    US OB Comp Less 14 Wks  Result Date: 04/28/2019 CLINICAL DATA:  Initial evaluation for acute vaginal bleeding, early pregnancy. EXAM: OBSTETRIC <14 WK ULTRASOUND TECHNIQUE: Transabdominal ultrasound was performed for evaluation of the gestation as well as the maternal uterus and adnexal regions. COMPARISON:  None available. FINDINGS: Intrauterine gestational sac: Single Yolk sac:  Present Embryo:  Present Cardiac Activity: Present Heart Rate: 159 bpm CRL: 24.8 mm   9 w 1 d                  [place] EDC: 11/30/2019 Subchorionic hemorrhage: Small subchorionic hemorrhage without associated mass effect. Maternal uterus/adnexae: Left ovary within normal limits. Right ovary not visualized. No adnexal mass or free fluid. IMPRESSION: 1. Single viable intrauterine pregnancy as above, estimated gestational age [redacted] weeks and 1  day by crown-rump length, with ultrasound EDC of 11/30/2019. 2. Small subchorionic hemorrhage without associated mass effect. 3. No other acute maternal uterine or adnexal abnormality identified. Electronically Signed   By: Rise Mu M.D.   On: 04/28/2019 03:28   Meds ordered this encounter  Medications  . ondansetron (ZOFRAN-ODT) disintegrating tablet 4 mg  . acetaminophen (TYLENOL) tablet 650 mg  . Prenatal Vit-Fe Fumarate-FA (PREPLUS) 27-1 MG TABS    Sig: Take 1 tablet by mouth daily.    Dispense:  30 tablet    Refill:  13    Order Specific Question:   Supervising Provider    Answer:   Despina Hidden, LUTHER H [[id number]]  . promethazine (PHENERGAN) 25 MG tablet    Sig: Take 1 tablet (25 mg total) by mouth every 6 (six) hours as needed for nausea or vomiting.    Dispense:  30 tablet    Refill:  0    Order Specific Question:   Supervising Provider    Answer:   Lazaro Arms [[id number]]   Assessment and Plan  --29 y.o. [id number] with [facility] at [redacted]w[redacted]d  --Subchorionic Hematoma, advised pelvic rest --Discharge home in stable condition with bleeding precautions  Calvert Cantor, CNM 04/28/2019, 5:20 AM

## 2019-04-30 LAB — CULTURE, OB URINE: Culture: >=100000 — AB

## 2019-05-01 ENCOUNTER — Telehealth: Payer: Self-pay | Admitting: Advanced Practice Midwife

## 2019-05-01 ENCOUNTER — Other Ambulatory Visit: Payer: Self-pay | Admitting: Advanced Practice Midwife

## 2019-05-01 MED ORDER — NITROFURANTOIN MONOHYD MACRO 100 MG PO CAPS: 100.0000 mg | ORAL_CAPSULE | Freq: Two times a day (BID) | ORAL | 1 refills | Status: DC

## 2019-05-01 NOTE — Progress Notes (Signed)
Patient returned my voicemail. + UTI, confirmed anaphylactic allergy to Penicillins and Cephalosporins. Pharmacy updated per patient preference.  Clayton Bibles, MSN, CNM Certified Nurse Midwife, [facility] for [employer], [facility] 05/01/19 8:47 AM

## 2019-05-01 NOTE — Telephone Encounter (Signed)
VLTCB  Clayton Bibles, MSN, CNM Certified Nurse Midwife, [employer] for [employer], [facility] 05/01/19 8:13 AM

## 2020-05-10 ENCOUNTER — Emergency Department: Payer: Medicaid Other

## 2020-05-10 ENCOUNTER — Other Ambulatory Visit: Payer: Self-pay

## 2020-05-10 ENCOUNTER — Emergency Department
Admission: EM | Admit: 2020-05-10 | Discharge: 2020-05-10 | Disposition: A | Discharge status: Home / Self Care | Payer: Medicaid Other | Attending: Emergency Medicine | Admitting: Emergency Medicine

## 2020-05-10 DIAGNOSIS — J45909 Unspecified asthma, uncomplicated: Secondary | ICD-10-CM | POA: Diagnosis not present

## 2020-05-10 DIAGNOSIS — S62316A Displaced fracture of base of fifth metacarpal bone, right hand, initial encounter for closed fracture: Secondary | ICD-10-CM | POA: Diagnosis not present

## 2020-05-10 DIAGNOSIS — S6991XA Unspecified injury of right wrist, hand and finger(s), initial encounter: Secondary | ICD-10-CM | POA: Diagnosis present

## 2020-05-10 MED ORDER — ONDANSETRON 4 MG PO TBDP
4.0000 mg | ORAL_TABLET | Freq: Once | ORAL | Status: AC
  Administered 2020-05-10: 4 mg via ORAL
  Filled 2020-05-10: qty 1

## 2020-05-10 MED ORDER — HYDROCODONE-ACETAMINOPHEN 5-325 MG PO TABS: 1.0000 | ORAL_TABLET | ORAL | 0 refills | Status: DC | PRN

## 2020-05-10 MED ORDER — HYDROCODONE-ACETAMINOPHEN 5-325 MG PO TABS
1.0000 | ORAL_TABLET | Freq: Once | ORAL | Status: AC
  Administered 2020-05-10: 1 via ORAL
  Filled 2020-05-10: qty 1
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
## 2020-05-26 ENCOUNTER — Other Ambulatory Visit: Payer: Self-pay | Admitting: Orthopedic Surgery

## 2020-05-26 DIAGNOSIS — Z01811 Encounter for preprocedural respiratory examination: Secondary | ICD-10-CM

## 2020-06-10 NOTE — Patient Instructions (Addendum)
DUE TO COVID-19 ONLY ONE VISITOR IS ALLOWED TO COME WITH YOU AND STAY IN THE WAITING ROOM ONLY DURING PRE OP AND PROCEDURE DAY OF SURGERY. THE 1 VISITOR  MAY VISIT WITH YOU AFTER SURGERY IN YOUR PRIVATE ROOM DURING VISITING HOURS ONLY!  YOU NEED TO HAVE A COVID 19 TEST ON: 06/15/20 @ 2:00 PM, THIS TEST MUST BE DONE BEFORE SURGERY,  COVID TESTING SITE [address] [place] [address], IT IS ON THE RIGHT GOING OUT [address] APPROXIMATELY  2 MINUTES PAST [employer] ON THE RIGHT. ONCE YOUR COVID TEST IS COMPLETED,  PLEASE BEGIN THE QUARANTINE INSTRUCTIONS AS OUTLINED IN YOUR HANDOUT.                Whitney Simpson   Your procedure is scheduled on: 06/18/20    Report to [facility] Main  Entrance   Report to short stay at: 5:15 AM     Call this number if you have problems the morning of surgery [phone]    Remember: NO SOLID FOOD AFTER MIDNIGHT THE NIGHT PRIOR TO SURGERY. NOTHING BY MOUTH EXCEPT CLEAR LIQUIDS UNTIL: 4:30 AM . PLEASE FINISH ENSURE DRINK PER SURGEON ORDER  WHICH NEEDS TO BE COMPLETED AT : 4:30 AM.  CLEAR LIQUID DIET  Foods Allowed                                                                     Foods Excluded  Coffee and tea, regular and decaf                             liquids that you cannot  Plain Jell-O any favor except red or purple                                           see through such as: Fruit ices (not with fruit pulp)                                     milk, soups, orange juice  Iced Popsicles                                    All solid food Carbonated beverages, regular and diet                                    Cranberry, grape and apple juices Sports drinks like Gatorade Lightly seasoned clear broth or consume(fat free) Sugar, honey syrup  Sample Menu Breakfast                                Lunch  Supper Cranberry juice                    Beef broth                             Chicken broth Jell-O                                     Grape juice                           Apple juice Coffee or tea                        Jell-O                                      Popsicle                                                Coffee or tea                        Coffee or tea  _____________________________________________________________________  BRUSH YOUR TEETH MORNING OF SURGERY AND RINSE YOUR MOUTH OUT, NO CHEWING GUM CANDY OR MINTS.                               You may not have any metal on your body including hair pins and              piercings  Do not wear jewelry, make-up, lotions, powders or perfumes, deodorant             Do not wear nail polish on your fingernails.  Do not shave  48 hours prior to surgery.    Do not bring valuables to the hospital. [facility] IS NOT             RESPONSIBLE   FOR VALUABLES.  Contacts, dentures or bridgework may not be worn into surgery.  Leave suitcase in the car. After surgery it may be brought to your room.     Patients discharged the day of surgery will not be allowed to drive home. IF YOU ARE HAVING SURGERY AND GOING HOME THE SAME DAY, YOU MUST HAVE AN ADULT TO DRIVE YOU HOME AND BE WITH YOU FOR 24 HOURS. YOU MAY GO HOME BY TAXI OR UBER OR ORTHERWISE, BUT AN ADULT MUST ACCOMPANY YOU HOME AND STAY WITH YOU FOR 24 HOURS.  Name and phone number of your driver:  Special Instructions: N/A              Please read over the following fact sheets you were given: _____________________________________________________________________         [facility] - Preparing for Surgery Before surgery, you can play an important role.  Because skin is not sterile, your skin needs to be as free of germs as possible.  You can reduce the number of germs on your skin by washing with CHG (chlorahexidine gluconate) soap before surgery.  CHG is an antiseptic cleaner which kills germs and bonds with the skin to continue killing germs even after  washing. Please DO NOT use if you have an allergy to CHG or antibacterial soaps.  If your skin becomes reddened/irritated stop using the CHG and inform your nurse when you arrive at Short Stay. Do not shave (including legs and underarms) for at least 48 hours prior to the first CHG shower.  You may shave your face/neck. Please follow these instructions carefully:  1.  Shower with CHG Soap the night before surgery and the  morning of Surgery.  2.  If you choose to wash your hair, wash your hair first as usual with your  normal  shampoo.  3.  After you shampoo, rinse your hair and body thoroughly to remove the  shampoo.                           4.  Use CHG as you would any other liquid soap.  You can apply chg directly  to the skin and wash                       Gently with a scrungie or clean washcloth.  5.  Apply the CHG Soap to your body ONLY FROM THE NECK DOWN.   Do not use on face/ open                           Wound or open sores. Avoid contact with eyes, ears mouth and genitals (private parts).                       Wash face,  Genitals (private parts) with your normal soap.             6.  Wash thoroughly, paying special attention to the area where your surgery  will be performed.  7.  Thoroughly rinse your body with warm water from the neck down.  8.  DO NOT shower/wash with your normal soap after using and rinsing off  the CHG Soap.                9.  Pat yourself dry with a clean towel.            10.  Wear clean pajamas.            11.  Place clean sheets on your bed the night of your first shower and do not  sleep with pets. Day of Surgery : Do not apply any lotions/deodorants the morning of surgery.  Please wear clean clothes to the hospital/surgery center.  FAILURE TO FOLLOW THESE INSTRUCTIONS MAY RESULT IN THE CANCELLATION OF YOUR SURGERY PATIENT SIGNATURE_________________________________  NURSE  SIGNATURE__________________________________  ________________________________________________________________________   Whitney Simpson  An incentive spirometer is a tool that can help keep your lungs clear and active. This tool measures how well you are filling your lungs with each breath. Taking long deep breaths may help reverse or decrease the chance of developing breathing (pulmonary) problems (especially infection) following:  A long period of time when you are unable to move or be active. BEFORE THE PROCEDURE   If the spirometer includes an indicator to show your best effort, your nurse or respiratory therapist will set it to a desired goal.  If possible, sit up straight or lean slightly forward. Try not to slouch.  Hold the incentive spirometer in an upright position. INSTRUCTIONS FOR USE  1. Sit on the edge of your bed if possible, or sit up as far as you can in bed or on a chair. 2. Hold the incentive spirometer in an upright position. 3. Breathe out normally. 4. Place the mouthpiece in your mouth and seal your lips tightly around it. 5. Breathe in slowly and as deeply as possible, raising the piston or the ball toward the top of the column. 6. Hold your breath for 3-5 seconds or for as long as possible. Allow the piston or ball to fall to the bottom of the column. 7. Remove the mouthpiece from your mouth and breathe out normally. 8. Rest for a few seconds and repeat Steps 1 through 7 at least 10 times every 1-2 hours when you are awake. Take your time and take a few normal breaths between deep breaths. 9. The spirometer may include an indicator to show your best effort. Use the indicator as a goal to work toward during each repetition. 10. After each set of 10 deep breaths, practice coughing to be sure your lungs are clear. If you have an incision (the cut made at the time of surgery), support your incision when coughing by placing a pillow or rolled up towels firmly  against it. Once you are able to get out of bed, walk around indoors and cough well. You may stop using the incentive spirometer when instructed by your caregiver.  RISKS AND COMPLICATIONS  Take your time so you do not get dizzy or light-headed.  If you are in pain, you may need to take or ask for pain medication before doing incentive spirometry. It is harder to take a deep breath if you are having pain. AFTER USE  Rest and breathe slowly and easily.  It can be helpful to keep track of a log of your progress. Your caregiver can provide you with a simple table to help with this. If you are using the spirometer at home, follow these instructions: [place] IF:   You are having difficultly using the spirometer.  You have trouble using the spirometer as often as instructed.  Your pain medication is not giving enough relief while using the spirometer.  You develop fever of 100.5 F (38.1 C) or higher. SEEK IMMEDIATE MEDICAL CARE IF:   You cough up bloody sputum that had not been present before.  You develop fever of 102 F (38.9 C) or greater.  You develop worsening pain at or near the incision site. MAKE SURE YOU:   Understand these instructions.  Will watch your condition.  Will get help right away if you are not doing well or get worse. Document Released: 06/19/2006 Document Revised: 05/01/2011 Document Reviewed: 08/20/2006 [facility] Patient Information 2014 [place], [place].   ________________________________________________________________________

## 2020-06-11 ENCOUNTER — Encounter (HOSPITAL_COMMUNITY)
Admission: RE | Admit: 2020-06-11 | Discharge: 2020-06-11 | Disposition: A | Discharge status: Home / Self Care | Payer: Medicaid Other | Source: Ambulatory Visit | Attending: Orthopedic Surgery | Admitting: Orthopedic Surgery

## 2020-06-11 ENCOUNTER — Ambulatory Visit (HOSPITAL_COMMUNITY)
Admission: RE | Admit: 2020-06-11 | Discharge: 2020-06-11 | Disposition: A | Discharge status: Home / Self Care | Payer: Medicaid Other | Source: Ambulatory Visit | Attending: Orthopedic Surgery | Admitting: Orthopedic Surgery

## 2020-06-11 ENCOUNTER — Encounter (HOSPITAL_COMMUNITY): Payer: Self-pay

## 2020-06-11 ENCOUNTER — Other Ambulatory Visit: Payer: Self-pay

## 2020-06-11 DIAGNOSIS — Z01811 Encounter for preprocedural respiratory examination: Secondary | ICD-10-CM | POA: Insufficient documentation

## 2020-06-11 HISTORY — DX: Depression, unspecified: F32.A

## 2020-06-11 HISTORY — DX: Anxiety disorder, unspecified: F41.9

## 2020-06-11 HISTORY — DX: Anemia, unspecified: D64.9

## 2020-06-11 LAB — URINALYSIS, ROUTINE W REFLEX MICROSCOPIC
Bacteria, UA: NONE SEEN
Bilirubin Urine: NEGATIVE
Glucose, UA: NEGATIVE mg/dL
Ketones, ur: NEGATIVE mg/dL
Leukocytes,Ua: NEGATIVE
Nitrite: NEGATIVE
Protein, ur: NEGATIVE mg/dL
Specific Gravity, Urine: 1.023 (ref 1.005–1.030)
pH: 6 (ref 5.0–8.0)

## 2020-06-11 LAB — COMPREHENSIVE METABOLIC PANEL
ALT: 17 U/L (ref 0–44)
AST: 17 U/L (ref 15–41)
Albumin: 4.4 g/dL (ref 3.5–5.0)
Alkaline Phosphatase: 98 U/L (ref 38–126)
Anion gap: 7 (ref 5–15)
BUN: 10 mg/dL (ref 6–20)
CO2: 26 mmol/L (ref 22–32)
Calcium: 9.8 mg/dL (ref 8.9–10.3)
Chloride: 113 mmol/L — ABNORMAL HIGH (ref 98–111)
Creatinine, Ser: 0.66 mg/dL (ref 0.44–1.00)
GFR, Estimated: >60 mL/min (ref >60)
Glucose, Bld: 95 mg/dL (ref 70–99)
Potassium: 4.1 mmol/L (ref 3.5–5.1)
Sodium: 146 mmol/L — ABNORMAL HIGH (ref 135–145)
Total Bilirubin: 0.2 mg/dL — ABNORMAL LOW (ref 0.3–1.2)
Total Protein: 7.9 g/dL (ref 6.5–8.1)

## 2020-06-11 LAB — SURGICAL PCR SCREEN
MRSA, PCR: NEGATIVE
Staphylococcus aureus: NEGATIVE

## 2020-06-11 LAB — PROTIME-INR
INR: 1.1 (ref 0.8–1.2)
Prothrombin Time: 13.8 seconds (ref 11.4–15.2)

## 2020-06-11 LAB — CBC WITH DIFFERENTIAL/PLATELET
Abs Immature Granulocytes: 0.01 10*3/uL (ref 0.00–0.07)
Basophils Absolute: 0 10*3/uL (ref 0.0–0.1)
Basophils Relative: 0 %
Eosinophils Absolute: 0.2 10*3/uL (ref 0.0–0.5)
Eosinophils Relative: 3 %
HCT: 39.4 % (ref 36.0–46.0)
Hemoglobin: 12.8 g/dL (ref 12.0–15.0)
Immature Granulocytes: 0 %
Lymphocytes Relative: 23 %
Lymphs Abs: 1.4 10*3/uL (ref 0.7–4.0)
MCH: 30.3 pg (ref 26.0–34.0)
MCHC: 32.5 g/dL (ref 30.0–36.0)
MCV: 93.4 fL (ref 80.0–100.0)
Monocytes Absolute: 0.4 10*3/uL (ref 0.1–1.0)
Monocytes Relative: 7 %
Neutro Abs: 4.2 10*3/uL (ref 1.7–7.7)
Neutrophils Relative %: 67 %
Platelets: 177 10*3/uL (ref 150–400)
RBC: 4.22 MIL/uL (ref 3.87–5.11)
RDW: 13.3 % (ref 11.5–15.5)
WBC: 6.2 10*3/uL (ref 4.0–10.5)
nRBC: 0 % (ref 0.0–0.2)

## 2020-06-11 LAB — APTT: aPTT: 34 seconds (ref 24–36)

## 2020-06-11 NOTE — Progress Notes (Signed)
UA result: Hgb urine dipstick: large.

## 2020-06-11 NOTE — Progress Notes (Signed)
COVID Vaccine Completed: Yes Date COVID Vaccine completed: 12/25/19 COVID vaccine manufacturer: Pfizer     PCP - No PCP Cardiologist -   Chest x-ray -  EKG -  Stress Test -  ECHO -  Cardiac Cath -  Pacemaker/ICD device last checked:  Sleep Study -  CPAP -   Fasting Blood Sugar -  Checks Blood Sugar _____ times a day  Blood Thinner Instructions: Aspirin Instructions: Last Dose:  Anesthesia review:   Patient denies shortness of breath, fever, cough and chest pain at PAT appointment   Patient verbalized understanding of instructions that were given to them at the PAT appointment. Patient was also instructed that they will need to review over the PAT instructions again at home before surgery.

## 2020-06-15 ENCOUNTER — Other Ambulatory Visit (HOSPITAL_COMMUNITY)
Admission: RE | Admit: 2020-06-15 | Discharge: 2020-06-15 | Disposition: A | Discharge status: Home / Self Care | Payer: Medicaid Other | Source: Ambulatory Visit | Attending: Orthopedic Surgery | Admitting: Orthopedic Surgery

## 2020-06-15 DIAGNOSIS — Z20822 Contact with and (suspected) exposure to covid-19: Secondary | ICD-10-CM | POA: Diagnosis not present

## 2020-06-15 DIAGNOSIS — Z01812 Encounter for preprocedural laboratory examination: Secondary | ICD-10-CM | POA: Insufficient documentation

## 2020-06-16 LAB — SARS CORONAVIRUS 2 (TAT 6-24 HRS): SARS Coronavirus 2: NEGATIVE

## 2020-06-17 ENCOUNTER — Encounter (HOSPITAL_COMMUNITY): Payer: Self-pay | Admitting: Orthopedic Surgery

## 2020-06-17 MED ORDER — BUPIVACAINE LIPOSOME 1.3 % IJ SUSP
10.0000 mL | Freq: Once | INTRAMUSCULAR | Status: DC
  Filled 2020-06-17: qty 10

## 2020-06-17 NOTE — Anesthesia Preprocedure Evaluation (Addendum)
Anesthesia Evaluation  Patient identified by MRN, date of birth, ID band Patient awake    Reviewed: Allergy & Precautions, NPO status , Patient's Chart, lab work & pertinent test results  Airway Mallampati: I  TM Distance: >3 FB Neck ROM: Full    Dental no notable dental hx. (+) Teeth Intact, Dental Advisory Given   Pulmonary asthma ,    Pulmonary exam normal breath sounds clear to auscultation       Cardiovascular negative cardio ROS Normal cardiovascular exam Rhythm:Regular Rate:Normal     Neuro/Psych PSYCHIATRIC DISORDERS Anxiety Depression negative neurological ROS     GI/Hepatic negative GI ROS, Neg liver ROS,   Endo/Other  Obesity  Renal/GU negative Renal ROS  negative genitourinary   Musculoskeletal  (+) Arthritis , Osteoarthritis,  Right hip DJD Hx/o pelvic Fx   Abdominal (+) + obese,   Peds  Hematology  (+) anemia ,   Anesthesia Other Findings   Reproductive/Obstetrics                           Anesthesia Physical Anesthesia Plan  ASA: II  Anesthesia Plan: Spinal   Post-op Pain Management:    Induction: Intravenous  PONV Risk Score and Plan: 3 and Treatment may vary due to age or medical condition, Midazolam, Propofol infusion and Ondansetron  Airway Management Planned: Natural Airway and Simple Face Mask  Additional Equipment:   Intra-op Plan:   Post-operative Plan:   Informed Consent: I have reviewed the patients History and Physical, chart, labs and discussed the procedure including the risks, benefits and alternatives for the proposed anesthesia with the patient or authorized representative who has indicated his/her understanding and acceptance.     Dental advisory given  Plan Discussed with: CRNA and Anesthesiologist  Anesthesia Plan Comments:        Anesthesia Quick Evaluation

## 2020-06-18 ENCOUNTER — Encounter (HOSPITAL_COMMUNITY): Payer: Self-pay | Admitting: Orthopedic Surgery

## 2020-06-18 ENCOUNTER — Ambulatory Visit (HOSPITAL_COMMUNITY): Payer: Medicaid Other

## 2020-06-18 ENCOUNTER — Ambulatory Visit (HOSPITAL_COMMUNITY): Payer: Medicaid Other | Admitting: Anesthesiology

## 2020-06-18 ENCOUNTER — Encounter (HOSPITAL_COMMUNITY)
Admission: RE | Disposition: A | Discharge status: Home / Self Care | Payer: Self-pay | Source: Home / Self Care | Attending: Orthopedic Surgery

## 2020-06-18 ENCOUNTER — Ambulatory Visit (HOSPITAL_COMMUNITY)
Admission: RE | Admit: 2020-06-18 | Discharge: 2020-06-19 | Disposition: A | Discharge status: Home / Self Care | Payer: Medicaid Other | Attending: Orthopedic Surgery | Admitting: Orthopedic Surgery

## 2020-06-18 ENCOUNTER — Other Ambulatory Visit: Payer: Self-pay

## 2020-06-18 DIAGNOSIS — M1611 Unilateral primary osteoarthritis, right hip: Secondary | ICD-10-CM

## 2020-06-18 DIAGNOSIS — Z880 Allergy status to penicillin: Secondary | ICD-10-CM | POA: Diagnosis not present

## 2020-06-18 DIAGNOSIS — Z87892 Personal history of anaphylaxis: Secondary | ICD-10-CM | POA: Insufficient documentation

## 2020-06-18 DIAGNOSIS — Z419 Encounter for procedure for purposes other than remedying health state, unspecified: Secondary | ICD-10-CM

## 2020-06-18 DIAGNOSIS — M6281 Muscle weakness (generalized): Secondary | ICD-10-CM | POA: Diagnosis not present

## 2020-06-18 DIAGNOSIS — R262 Difficulty in walking, not elsewhere classified: Secondary | ICD-10-CM | POA: Diagnosis not present

## 2020-06-18 DIAGNOSIS — Z79899 Other long term (current) drug therapy: Secondary | ICD-10-CM | POA: Diagnosis not present

## 2020-06-18 HISTORY — PX: TOTAL HIP ARTHROPLASTY: SHX124

## 2020-06-18 LAB — TYPE AND SCREEN
ABO/RH(D): O POS
Antibody Screen: NEGATIVE

## 2020-06-18 LAB — PREGNANCY, URINE: Preg Test, Ur: NEGATIVE

## 2020-06-18 SURGERY — ARTHROPLASTY, HIP, TOTAL, ANTERIOR APPROACH
Anesthesia: Spinal | Site: Hip | Laterality: Right

## 2020-06-18 MED ORDER — FENTANYL CITRATE (PF) 100 MCG/2ML IJ SOLN
INTRAMUSCULAR | Status: AC
  Filled 2020-06-18: qty 2

## 2020-06-18 MED ORDER — WATER FOR IRRIGATION, STERILE IR SOLN
Status: DC | PRN
  Administered 2020-06-18: 2000 mL

## 2020-06-18 MED ORDER — PROPOFOL 500 MG/50ML IV EMUL
INTRAVENOUS | Status: DC | PRN
  Administered 2020-06-18 (×2): 30 mg via INTRAVENOUS

## 2020-06-18 MED ORDER — OXYCODONE-ACETAMINOPHEN 5-325 MG PO TABS: 1.0000 | ORAL_TABLET | Freq: Four times a day (QID) | ORAL | 0 refills | Status: DC | PRN

## 2020-06-18 MED ORDER — DOCUSATE SODIUM 100 MG PO CAPS
100.0000 mg | ORAL_CAPSULE | Freq: Two times a day (BID) | ORAL | Status: DC
  Administered 2020-06-18 – 2020-06-19 (×2): 100 mg via ORAL
  Filled 2020-06-18 (×2): qty 1

## 2020-06-18 MED ORDER — DEXAMETHASONE SODIUM PHOSPHATE 10 MG/ML IJ SOLN
10.0000 mg | Freq: Two times a day (BID) | INTRAMUSCULAR | Status: DC
  Administered 2020-06-18 (×2): 10 mg via INTRAVENOUS
  Filled 2020-06-18 (×2): qty 1

## 2020-06-18 MED ORDER — VANCOMYCIN HCL 1000 MG/200ML IV SOLN
1000.0000 mg | Freq: Two times a day (BID) | INTRAVENOUS | Status: AC
  Administered 2020-06-18: 1000 mg via INTRAVENOUS
  Filled 2020-06-18: qty 200

## 2020-06-18 MED ORDER — TRAMADOL HCL 50 MG PO TABS
50.0000 mg | ORAL_TABLET | Freq: Four times a day (QID) | ORAL | Status: DC
  Administered 2020-06-18 – 2020-06-19 (×5): 50 mg via ORAL
  Filled 2020-06-18 (×5): qty 1

## 2020-06-18 MED ORDER — LACTATED RINGERS IV SOLN: INTRAVENOUS | Status: DC

## 2020-06-18 MED ORDER — BISACODYL 5 MG PO TBEC: 5.0000 mg | DELAYED_RELEASE_TABLET | Freq: Every day | ORAL | Status: DC | PRN

## 2020-06-18 MED ORDER — DOCUSATE SODIUM 100 MG PO CAPS: 100.0000 mg | ORAL_CAPSULE | Freq: Two times a day (BID) | ORAL | 0 refills | Status: DC

## 2020-06-18 MED ORDER — PHENYLEPHRINE HCL-NACL 10-0.9 MG/250ML-% IV SOLN
INTRAVENOUS | Status: DC | PRN
  Administered 2020-06-18: 30 ug/min via INTRAVENOUS

## 2020-06-18 MED ORDER — ASPIRIN EC 325 MG PO TBEC
325.0000 mg | DELAYED_RELEASE_TABLET | Freq: Two times a day (BID) | ORAL | Status: DC
  Administered 2020-06-18 – 2020-06-19 (×2): 325 mg via ORAL
  Filled 2020-06-18 (×2): qty 1

## 2020-06-18 MED ORDER — OXYCODONE HCL 5 MG PO TABS
ORAL_TABLET | ORAL | Status: AC
  Filled 2020-06-18: qty 1

## 2020-06-18 MED ORDER — SODIUM CHLORIDE 0.9 % IR SOLN
Status: DC | PRN
  Administered 2020-06-18: 1000 mL

## 2020-06-18 MED ORDER — ASPIRIN EC 325 MG PO TBEC: 325.0000 mg | DELAYED_RELEASE_TABLET | Freq: Two times a day (BID) | ORAL | 0 refills | Status: DC

## 2020-06-18 MED ORDER — EPHEDRINE SULFATE 50 MG/ML IJ SOLN
INTRAMUSCULAR | Status: DC | PRN
  Administered 2020-06-18 (×3): 10 mg via INTRAVENOUS
  Administered 2020-06-18: 5 mg via INTRAVENOUS

## 2020-06-18 MED ORDER — MIDAZOLAM HCL 5 MG/5ML IJ SOLN
INTRAMUSCULAR | Status: DC | PRN
  Administered 2020-06-18: 2 mg via INTRAVENOUS

## 2020-06-18 MED ORDER — BUPIVACAINE-EPINEPHRINE 0.25% -1:200000 IJ SOLN
INTRAMUSCULAR | Status: DC | PRN
  Administered 2020-06-18: 30 mL

## 2020-06-18 MED ORDER — OXYCODONE HCL 5 MG/5ML PO SOLN: 5.0000 mg | Freq: Once | ORAL | Status: AC | PRN

## 2020-06-18 MED ORDER — TRANEXAMIC ACID-NACL 1000-0.7 MG/100ML-% IV SOLN
1000.0000 mg | INTRAVENOUS | Status: AC
  Administered 2020-06-18: 1000 mg via INTRAVENOUS
  Filled 2020-06-18: qty 100

## 2020-06-18 MED ORDER — TIZANIDINE HCL 2 MG PO TABS: 2.0000 mg | ORAL_TABLET | Freq: Three times a day (TID) | ORAL | 0 refills | Status: DC | PRN

## 2020-06-18 MED ORDER — ONDANSETRON HCL 4 MG/2ML IJ SOLN: 4.0000 mg | Freq: Once | INTRAMUSCULAR | Status: DC | PRN

## 2020-06-18 MED ORDER — PROPOFOL 500 MG/50ML IV EMUL
INTRAVENOUS | Status: DC | PRN
  Administered 2020-06-18: 75 ug/kg/min via INTRAVENOUS

## 2020-06-18 MED ORDER — ORAL CARE MOUTH RINSE: 15.0000 mL | Freq: Once | OROMUCOSAL | Status: AC

## 2020-06-18 MED ORDER — PHENYLEPHRINE HCL (PRESSORS) 10 MG/ML IV SOLN
INTRAVENOUS | Status: AC
  Filled 2020-06-18: qty 3

## 2020-06-18 MED ORDER — BUPIVACAINE IN DEXTROSE 0.75-8.25 % IT SOLN
INTRATHECAL | Status: DC | PRN
  Administered 2020-06-18: 1.7 mL via INTRATHECAL

## 2020-06-18 MED ORDER — SODIUM CHLORIDE 0.9 % IV SOLN: INTRAVENOUS | Status: DC

## 2020-06-18 MED ORDER — POVIDONE-IODINE 10 % EX SWAB
2.0000 "application " | Freq: Once | CUTANEOUS | Status: AC
  Administered 2020-06-18: 2 via TOPICAL

## 2020-06-18 MED ORDER — OXYCODONE HCL 5 MG PO TABS
5.0000 mg | ORAL_TABLET | ORAL | Status: DC | PRN
  Administered 2020-06-18 – 2020-06-19 (×4): 10 mg via ORAL
  Filled 2020-06-18 (×4): qty 2

## 2020-06-18 MED ORDER — MAGNESIUM CITRATE PO SOLN: 1.0000 | Freq: Once | ORAL | Status: DC | PRN

## 2020-06-18 MED ORDER — PHENOL 1.4 % MT LIQD: 1.0000 | OROMUCOSAL | Status: DC | PRN

## 2020-06-18 MED ORDER — ONDANSETRON HCL 4 MG/2ML IJ SOLN
4.0000 mg | Freq: Four times a day (QID) | INTRAMUSCULAR | Status: DC | PRN
  Administered 2020-06-18: 4 mg via INTRAVENOUS
  Filled 2020-06-18: qty 2

## 2020-06-18 MED ORDER — VANCOMYCIN HCL 1000 MG IV SOLR
INTRAVENOUS | Status: DC | PRN
  Administered 2020-06-18: 1000 mg via INTRAVENOUS

## 2020-06-18 MED ORDER — ONDANSETRON HCL 4 MG PO TABS: 4.0000 mg | ORAL_TABLET | Freq: Four times a day (QID) | ORAL | Status: DC | PRN

## 2020-06-18 MED ORDER — ALUM & MAG HYDROXIDE-SIMETH 200-200-20 MG/5ML PO SUSP: 30.0000 mL | ORAL | Status: DC | PRN

## 2020-06-18 MED ORDER — MIDAZOLAM HCL 2 MG/2ML IJ SOLN
INTRAMUSCULAR | Status: AC
  Filled 2020-06-18: qty 2

## 2020-06-18 MED ORDER — ACETAMINOPHEN 325 MG PO TABS
325.0000 mg | ORAL_TABLET | Freq: Four times a day (QID) | ORAL | Status: DC | PRN
Start: 2020-06-19 — End: 2020-06-19
  Administered 2020-06-19: 650 mg via ORAL
  Filled 2020-06-18: qty 2

## 2020-06-18 MED ORDER — CHLORHEXIDINE GLUCONATE 0.12 % MT SOLN
15.0000 mL | Freq: Once | OROMUCOSAL | Status: AC
  Administered 2020-06-18: 15 mL via OROMUCOSAL

## 2020-06-18 MED ORDER — METHOCARBAMOL 500 MG IVPB - SIMPLE MED
500.0000 mg | Freq: Four times a day (QID) | INTRAVENOUS | Status: DC | PRN
  Filled 2020-06-18: qty 50

## 2020-06-18 MED ORDER — PHENYLEPHRINE HCL (PRESSORS) 10 MG/ML IV SOLN
INTRAVENOUS | Status: AC
  Filled 2020-06-18: qty 2

## 2020-06-18 MED ORDER — BUPIVACAINE LIPOSOME 1.3 % IJ SUSP
INTRAMUSCULAR | Status: DC | PRN
  Administered 2020-06-18: 10 mL

## 2020-06-18 MED ORDER — VANCOMYCIN HCL IN DEXTROSE 1-5 GM/200ML-% IV SOLN
1000.0000 mg | INTRAVENOUS | Status: AC
  Administered 2020-06-18: 1000 mg via INTRAVENOUS
  Filled 2020-06-18: qty 200

## 2020-06-18 MED ORDER — ZOLPIDEM TARTRATE 5 MG PO TABS
5.0000 mg | ORAL_TABLET | Freq: Every evening | ORAL | Status: DC | PRN
Start: 2020-06-18 — End: 2020-06-19

## 2020-06-18 MED ORDER — DIPHENHYDRAMINE HCL 12.5 MG/5ML PO ELIX: 12.5000 mg | ORAL_SOLUTION | ORAL | Status: DC | PRN

## 2020-06-18 MED ORDER — HYDROMORPHONE HCL 1 MG/ML IJ SOLN
0.2500 mg | INTRAMUSCULAR | Status: DC | PRN
  Administered 2020-06-18 (×2): 0.5 mg via INTRAVENOUS

## 2020-06-18 MED ORDER — HYDROMORPHONE HCL 1 MG/ML IJ SOLN
0.5000 mg | INTRAMUSCULAR | Status: DC | PRN
  Administered 2020-06-18 (×2): 1 mg via INTRAVENOUS
  Filled 2020-06-18 (×2): qty 1

## 2020-06-18 MED ORDER — POLYETHYLENE GLYCOL 3350 17 G PO PACK: 17.0000 g | PACK | Freq: Every day | ORAL | Status: DC | PRN

## 2020-06-18 MED ORDER — OXYCODONE HCL 5 MG PO TABS
5.0000 mg | ORAL_TABLET | Freq: Once | ORAL | Status: AC | PRN
  Administered 2020-06-18: 5 mg via ORAL

## 2020-06-18 MED ORDER — METHOCARBAMOL 500 MG PO TABS
500.0000 mg | ORAL_TABLET | Freq: Four times a day (QID) | ORAL | Status: DC | PRN
  Administered 2020-06-18 – 2020-06-19 (×2): 500 mg via ORAL
  Filled 2020-06-18 (×2): qty 1

## 2020-06-18 MED ORDER — FENTANYL CITRATE (PF) 100 MCG/2ML IJ SOLN
INTRAMUSCULAR | Status: DC | PRN
  Administered 2020-06-18: 100 ug via INTRAVENOUS

## 2020-06-18 MED ORDER — ONDANSETRON HCL 4 MG/2ML IJ SOLN
INTRAMUSCULAR | Status: DC | PRN
  Administered 2020-06-18: 4 mg via INTRAVENOUS

## 2020-06-18 MED ORDER — TRANEXAMIC ACID-NACL 1000-0.7 MG/100ML-% IV SOLN
1000.0000 mg | Freq: Once | INTRAVENOUS | Status: AC
  Administered 2020-06-18: 1000 mg via INTRAVENOUS
  Filled 2020-06-18: qty 100

## 2020-06-18 MED ORDER — BUPIVACAINE-EPINEPHRINE (PF) 0.25% -1:200000 IJ SOLN
INTRAMUSCULAR | Status: AC
  Filled 2020-06-18: qty 30

## 2020-06-18 MED ORDER — HYDROMORPHONE HCL 1 MG/ML IJ SOLN
INTRAMUSCULAR | Status: AC
  Filled 2020-06-18: qty 1

## 2020-06-18 MED ORDER — MENTHOL 3 MG MT LOZG: 1.0000 | LOZENGE | OROMUCOSAL | Status: DC | PRN

## 2020-06-18 SURGICAL SUPPLY — 41 items
APL SKNCLS STERI-STRIP NONHPOA (GAUZE/BANDAGES/DRESSINGS) ×1
BAG SPEC THK2 15X12 ZIP CLS (MISCELLANEOUS)
BAG ZIPLOCK 12X15 (MISCELLANEOUS) IMPLANT
BENZOIN TINCTURE PRP APPL 2/3 (GAUZE/BANDAGES/DRESSINGS) ×2 IMPLANT
BLADE SAW SGTL 18X1.27X75 (BLADE) ×2 IMPLANT
BLADE SURG SZ10 CARB STEEL (BLADE) ×4 IMPLANT
COVER PERINEAL POST (MISCELLANEOUS) ×2 IMPLANT
COVER SURGICAL LIGHT HANDLE (MISCELLANEOUS) ×2 IMPLANT
COVER WAND RF STERILE (DRAPES) ×2 IMPLANT
CUP ACETBLR 52 OD 100 SERIES (Hips) ×2 IMPLANT
DECANTER SPIKE VIAL GLASS SM (MISCELLANEOUS) IMPLANT
DRAPE STERI IOBAN 125X83 (DRAPES) ×2 IMPLANT
DRAPE U-SHAPE 47X51 STRL (DRAPES) ×4 IMPLANT
DRSG AQUACEL AG ADV 3.5X 6 (GAUZE/BANDAGES/DRESSINGS) ×2 IMPLANT
DURAPREP 26ML APPLICATOR (WOUND CARE) ×2 IMPLANT
ELECT BLADE TIP CTD 4 INCH (ELECTRODE) IMPLANT
ELECT REM PT RETURN 15FT ADLT (MISCELLANEOUS) ×2 IMPLANT
ELIMINATOR HOLE APEX DEPUY (Hips) ×2 IMPLANT
GAUZE XEROFORM 1X8 LF (GAUZE/BANDAGES/DRESSINGS) IMPLANT
GLOVE SRG 8 PF TXTR STRL LF DI (GLOVE) ×2 IMPLANT
GLOVE SURG LTX SZ7.5 (GLOVE) ×4 IMPLANT
GLOVE SURG UNDER POLY LF SZ8 (GLOVE) ×4
GOWN STRL REUS W/TWL XL LVL3 (GOWN DISPOSABLE) ×4 IMPLANT
HEAD CERAMIC 36 PLUS 8.5 12 14 (Hips) ×2 IMPLANT
HOLDER FOLEY CATH W/STRAP (MISCELLANEOUS) ×2 IMPLANT
HOOD PEEL AWAY FLYTE STAYCOOL (MISCELLANEOUS) ×8 IMPLANT
KIT TURNOVER KIT A (KITS) ×2 IMPLANT
LINER ACETAB NEUTRAL 36ID 520D (Liner) ×2 IMPLANT
NEEDLE HYPO 22GX1.5 SAFETY (NEEDLE) ×2 IMPLANT
PACK ANTERIOR HIP CUSTOM (KITS) ×2 IMPLANT
PENCIL SMOKE EVACUATOR (MISCELLANEOUS) ×2 IMPLANT
STAPLER VISISTAT 35W (STAPLE) IMPLANT
STEM CORAIL KA10 (Stem) ×2 IMPLANT
STRIP CLOSURE SKIN 1/2X4 (GAUZE/BANDAGES/DRESSINGS) ×2 IMPLANT
SUT ETHIBOND NAB CT1 #1 30IN (SUTURE) ×4 IMPLANT
SUT MNCRL AB 3-0 PS2 18 (SUTURE) ×2 IMPLANT
SUT VIC AB 0 CT1 36 (SUTURE) ×2 IMPLANT
SUT VIC AB 1 CT1 36 (SUTURE) ×2 IMPLANT
SUT VIC AB 2-0 CT1 27 (SUTURE) ×2
SUT VIC AB 2-0 CT1 TAPERPNT 27 (SUTURE) ×1 IMPLANT
TRAY FOLEY MTR SLVR 14FR STAT (SET/KITS/TRAYS/PACK) ×2 IMPLANT

## 2020-06-18 NOTE — Evaluation (Addendum)
Physical Therapy Evaluation Patient Details Name: Whitney Simpson MRN: [id number] DOB: [birth year] Today's Date: 06/18/2020   History of Present Illness  Patient is 30 y.o. female s/p Rt THA anterior approach on 06/18/20 with PMH significant for asthma, anemia, depression, anxiety, arthritis, pelvic fracture.    Clinical Impression  Whitney Simpson is a 30 y.o. female POD 0 s/p Rt THA. Patient reports independence with mobility at baseline. Patient is now limited by functional impairments (see PT problem list below) and requires min assist for transfers and gait with RW. Patient was able to ambulate ~50 feet with RW and min assist. Patient instructed in exercise to facilitate circulation to manage edema and reduce risk of DVT. Patient will benefit from continued skilled PT interventions to address impairments and progress towards PLOF. Acute PT will follow to progress mobility and stair training in preparation for safe discharge home.     Follow Up Recommendations Follow surgeon's recommendation for DC plan and follow-up therapies    Equipment Recommendations  Rolling walker with 5" wheels    Recommendations for Other Services       Precautions / Restrictions Precautions Precautions: Fall Restrictions Weight Bearing Restrictions: No Other Position/Activity Restrictions: WBAT      Mobility  Bed Mobility Overal bed mobility: Needs Assistance Bed Mobility: Supine to Sit     Supine to sit: Min assist;HOB elevated     General bed mobility comments: cues to use bed rail and assist for Rt LE to mobilize to EOB.    Transfers Overall transfer level: Needs assistance Equipment used: Rolling walker (2 wheeled) Transfers: Sit to/from Stand Sit to Stand: Min assist         General transfer comment: cues for technique with RW, assist for power up from EOB pt steady once standing.  Ambulation/Gait Ambulation/Gait assistance: Min assist Gait Distance (Feet): 50 Feet Assistive  device: Rolling walker (2 wheeled) Gait Pattern/deviations: Step-to pattern;Decreased stride length;Step-through pattern Gait velocity: decr   General Gait Details: cues for step to pattern and proximity to RW. pt progressed to step through with no overt LOB, improved velocity and pt reports more comfortable.  Stairs            Wheelchair Mobility    Modified Rankin (Stroke Patients Only)       Balance Overall balance assessment: Needs assistance Sitting-balance support: Feet supported Sitting balance-Leahy Scale: Fair     Standing balance support: During functional activity;Bilateral upper extremity supported Standing balance-Leahy Scale: Good                               Pertinent Vitals/Pain Pain Assessment: 0-10 Pain Score: 3  Pain Location: Rt hip Pain Descriptors / Indicators: Aching;Discomfort Pain Intervention(s): Limited activity within patient's tolerance;Monitored during session;Repositioned;Premedicated before session    Home Living Family/patient expects to be discharged to:: Private residence Living Arrangements: Parent;Children (has 3 children) Available Help at Discharge: Family Type of Home: House Home Access: Stairs to enter Entrance Stairs-Rails: Can reach both Entrance Stairs-Number of Steps: 6 Home Layout: One level Home Equipment: Cane - single point      Prior Function Level of Independence: Independent         Comments: pt has 3 young children and is typically independent with all mobility and ADL's     Hand Dominance        Extremity/Trunk Assessment   Upper Extremity Assessment Upper Extremity Assessment: Overall WFL for tasks assessed  Lower Extremity Assessment Lower Extremity Assessment: Overall WFL for tasks assessed    Cervical / Trunk Assessment Cervical / Trunk Assessment: Normal  Communication   Communication: No difficulties  Cognition Arousal/Alertness: Awake/alert Behavior During Therapy:  WFL for tasks assessed/performed Overall Cognitive Status: Within Functional Limits for tasks assessed                                        General Comments      Exercises Total Joint Exercises Ankle Circles/Pumps: AROM;20 reps;Both;Seated   Assessment/Plan    PT Assessment Patient needs continued PT services  PT Problem List Decreased strength;Decreased activity tolerance;Decreased range of motion;Decreased balance;Decreased mobility;Decreased knowledge of use of DME;Decreased knowledge of precautions;Pain       PT Treatment Interventions DME instruction;Gait training;Stair training;Therapeutic activities;Functional mobility training;Therapeutic exercise;Balance training;Patient/family education    PT Goals (Current goals can be found in the Care Plan section)  Acute Rehab PT Goals Patient Stated Goal: regain independence and hip to stop hurting PT Goal Formulation: With patient Time For Goal Achievement: 06/25/20 Potential to Achieve Goals: Good    Frequency 7X/week   Barriers to discharge        Co-evaluation               AM-PAC PT "6 Clicks" Mobility  Outcome Measure Help needed turning from your back to your side while in a flat bed without using bedrails?: None Help needed moving from lying on your back to sitting on the side of a flat bed without using bedrails?: A Little Help needed moving to and from a bed to a chair (including a wheelchair)?: A Little Help needed standing up from a chair using your arms (e.g., wheelchair or bedside chair)?: A Little Help needed to walk in hospital room?: A Little Help needed climbing 3-5 steps with a railing? : A Little 6 Click Score: 19    End of Session Equipment Utilized During Treatment: Gait belt Activity Tolerance: Patient tolerated treatment well Patient left: in chair;with call bell/phone within reach;with chair alarm set Nurse Communication: Mobility status PT Visit Diagnosis: Muscle  weakness (generalized) (M62.81);Difficulty in walking, not elsewhere classified (R26.2)    Time: 4431-5400 PT Time Calculation (min) (ACUTE ONLY): 23 min   Charges:   PT Evaluation $PT Eval Low Complexity: 1 Low PT Treatments $Gait Training: 8-22 mins        Wynn Maudlin, DPT Acute Rehabilitation Services Office [phone] Pager [phone]    Whitney Simpson 06/18/2020, 6:12 PM

## 2020-06-18 NOTE — Brief Op Note (Signed)
06/18/2020  9:55 AM  PATIENT:  Whitney Simpson  30 y.o. female  PRE-OPERATIVE DIAGNOSIS:  RIGHT HIP DEGENERATIVE JOINT DISEASE  POST-OPERATIVE DIAGNOSIS:  RIGHT HIP DEGENERATIVE JOINT DISEASE  PROCEDURE:  Procedure(s): TOTAL HIP ARTHROPLASTY ANTERIOR APPROACH (Right)  SURGEON:  Surgeon(s) and Role:    Jodi Geralds, MD - Primary  PHYSICIAN ASSISTANT:   ASSISTANTS: jim bethune pAc   ANESTHESIA:   spinal  EBL:  400 mL   BLOOD ADMINISTERED:none  DRAINS: none   LOCAL MEDICATIONS USED:  MARCAINE    and OTHER Experel  SPECIMEN:  No Specimen  DISPOSITION OF SPECIMEN:  N/A  COUNTS:  YES  TOURNIQUET:  * No tourniquets in log *  DICTATION: .Other Dictation: Dictation Number [id number]  PLAN OF CARE: Admit for overnight observation  PATIENT DISPOSITION:  PACU - hemodynamically stable.   Delay start of Pharmacological VTE agent (>24hrs) due to surgical blood loss or risk of bleeding: no

## 2020-06-18 NOTE — Discharge Instructions (Signed)
Keep dressing in place on right hip. You may shower with the dressing on.  Do not let water get under the dressing. ambulate weightbearing as tolerated on the right lower extremity.

## 2020-06-18 NOTE — Anesthesia Procedure Notes (Signed)
Spinal  Patient location during procedure: OR Start time: 06/18/2020 7:34 AM End time: 06/18/2020 7:37 AM Reason for block: surgical anesthesia Staffing Performed: anesthesiologist  Anesthesiologist: Mal Amabile, MD Preanesthetic Checklist Completed: patient identified, IV checked, site marked, risks and benefits discussed, surgical consent, monitors and equipment checked, pre-op evaluation and timeout performed Spinal Block Patient position: sitting Prep: DuraPrep and site prepped and draped Patient monitoring: heart rate, cardiac monitor, continuous pulse ox and blood pressure Approach: midline Location: L3-4 Injection technique: single-shot Needle Needle type: Pencan  Needle gauge: 24 G Needle length: 9 cm Assessment Sensory level: T4 Events: CSF return Additional Notes Patient tolerated procedure well. Adequate sensory level.

## 2020-06-18 NOTE — H&P (Signed)
TOTAL HIP ADMISSION H&P  Patient is admitted for right total hip arthroplasty.  Subjective:  Chief Complaint: right hip pain  HPI: Whitney Simpson, 30 y.o. female, has a history of pain and functional disability in the right hip(s) due to arthritis and patient has failed non-surgical conservative treatments for greater than 12 weeks to include NSAID's and/or analgesics, use of assistive devices, weight reduction as appropriate and activity modification.  Onset of symptoms was gradual starting 9 years ago with gradually worsening course since that time.The patient noted no past surgery on the right hip(s).  Patient currently rates pain in the right hip at 10 out of 10 with activity. Patient has night pain, worsening of pain with activity and weight bearing, trendelenberg gait, pain that interfers with activities of daily living and joint swelling. Patient has evidence of subchondral sclerosis, periarticular osteophytes, joint subluxation and joint space narrowing by imaging studies. This condition presents safety issues increasing the risk of falls. This patient has had Failure of all reasonable conservative care.  The patient has had severe pelvic injury giving her severely altered pelvic anatomy..  There is no current active infection.  Patient Active Problem List   Diagnosis Date Noted  . Primary osteoarthritis of right hip 06/18/2020  . Intrauterine pregnancy 04/28/2019  . Subchorionic hematoma in first trimester 04/28/2019  . Anxiety state, unspecified 04/19/2013  . MDD (major depressive disorder), recurrent episode (HCC) 04/18/2013   Past Medical History:  Diagnosis Date  . Anemia   . Anxiety   . Arthritis   . Asthma   . Depression   . No pertinent past medical history   . Pelvic fracture [facility])     Past Surgical History:  Procedure Laterality Date  . BONY PELVIS SURGERY    . CESAREAN SECTION    . COLOSTOMY      Current Facility-Administered Medications  Medication Dose Route  Frequency Provider Last Rate Last Admin  . bupivacaine liposome (EXPAREL) 1.3 % injection 133 mg  10 mL Other Once Jodi Geralds, MD      . bupivacaine liposome (EXPAREL) 1.3 % injection    PRN Jodi Geralds, MD   10 mL at 06/18/20 0842  . bupivacaine-EPINEPHrine (MARCAINE W/ EPI) 0.25% -1:200000 (with pres) injection    PRN Jodi Geralds, MD   30 mL at 06/18/20 0843  . HYDROmorphone (DILAUDID) injection 0.25-0.5 mg  0.25-0.5 mg Intravenous Q5 min PRN Mal Amabile, MD      . lactated ringers infusion   Intravenous Continuous Cecile Hearing, MD 10 mL/hr at 06/18/20 0730 New Bag at 06/18/20 0834  . ondansetron (ZOFRAN) injection 4 mg  4 mg Intravenous Once PRN Mal Amabile, MD      . oxyCODONE (Oxy IR/ROXICODONE) immediate release tablet 5 mg  5 mg Oral Once PRN Mal Amabile, MD       Or  . oxyCODONE (ROXICODONE) 5 MG/5ML solution 5 mg  5 mg Oral Once PRN Mal Amabile, MD      . sodium chloride irrigation 0.9 %    PRN Jodi Geralds, MD   1,000 mL at 06/18/20 0821  . water for irrigation, sterile for irrigation SOLN    PRN Jodi Geralds, MD   2,000 mL at 06/18/20 3212   Allergies  Allergen Reactions  . Penicillins Anaphylaxis and Hives    Throat closing and hives    Social History   Tobacco Use  . Smoking status: Never Smoker  . Smokeless tobacco: Never Used  Substance Use Topics  .  Alcohol use: Yes    Comment: ocass.    Family History  Problem Relation Age of Onset  . Diabetes Other   . Hypertension Other   . Anesthesia problems Neg Hx   . Hypotension Neg Hx   . Malignant hyperthermia Neg Hx   . Pseudochol deficiency Neg Hx      Review of Systems ROS: I have reviewed the patient's review of systems thoroughly and there are no positive responses as relates to the HPI. Objective:  Physical Exam  Vital signs in last 24 hours: Temp:  [98 F (36.7 C)] 98 F (36.7 C) (04/29 0546) Pulse Rate:  [72] 72 (04/29 0546) Resp:  [16] 16 (04/29 0546) BP: (127)/(69) 127/69  (04/29 0546) SpO2:  [99 %] 99 % (04/29 0546) Weight:  [94.7 kg] 94.7 kg (04/29 0627) Well-developed well-nourished patient in no acute distress. Alert and oriented x3 HEENT:within normal limits Cardiac: Regular rate and rhythm Pulmonary: Lungs clear to auscultation Abdomen: Soft and nontender.  Normal active bowel sounds  Musculoskeletal: Right hip: Painful range of motion.  Extremely limited range of motion.  Neurovascular intact distally. Labs: Recent Results (from the past 2160 hour(s))  Surgical pcr screen     Status: None   Collection Time: 06/11/20  1:51 PM   Specimen: Nasal Mucosa; Nasal Swab  Result Value Ref Range   MRSA, PCR NEGATIVE NEGATIVE   Staphylococcus aureus NEGATIVE NEGATIVE    Comment: (NOTE) The Xpert SA Assay (FDA approved for NASAL specimens in patients 64 years of age and older), is one component of a comprehensive surveillance program. It is not intended to diagnose infection nor to guide or monitor treatment. Performed at [facility], [address] [place], [place] [address]   CBC WITH DIFFERENTIAL     Status: None   Collection Time: 06/11/20  1:51 PM  Result Value Ref Range   WBC 6.2 4.0 - 10.5 K/uL   RBC 4.22 3.87 - 5.11 MIL/uL   Hemoglobin 12.8 12.0 - 15.0 g/dL   HCT 23.5 57.3 - 22.0 %   MCV 93.4 80.0 - 100.0 fL   MCH 30.3 26.0 - 34.0 pg   MCHC 32.5 30.0 - 36.0 g/dL   RDW 25.4 27.0 - 62.3 %   Platelets 177 150 - 400 K/uL   nRBC 0.0 0.0 - 0.2 %   Neutrophils Relative % 67 %   Neutro Abs 4.2 1.7 - 7.7 K/uL   Lymphocytes Relative 23 %   Lymphs Abs 1.4 0.7 - 4.0 K/uL   Monocytes Relative 7 %   Monocytes Absolute 0.4 0.1 - 1.0 K/uL   Eosinophils Relative 3 %   Eosinophils Absolute 0.2 0.0 - 0.5 K/uL   Basophils Relative 0 %   Basophils Absolute 0.0 0.0 - 0.1 K/uL   Immature Granulocytes 0 %   Abs Immature Granulocytes 0.01 0.00 - 0.07 K/uL    Comment: Performed at [facility], [address],  [place], [place] [address]  Comprehensive metabolic panel     Status: Abnormal   Collection Time: 06/11/20  1:51 PM  Result Value Ref Range   Sodium 146 (H) 135 - 145 mmol/L   Potassium 4.1 3.5 - 5.1 mmol/L   Chloride 113 (H) 98 - 111 mmol/L   CO2 26 22 - 32 mmol/L   Glucose, Bld 95 70 - 99 mg/dL    Comment: Glucose reference range applies only to samples taken after fasting for at least 8 hours.  BUN 10 6 - 20 mg/dL   Creatinine, Ser 4.090.66 0.44 - 1.00 mg/dL   Calcium 9.8 8.9 - 81.110.3 mg/dL   Total Protein 7.9 6.5 - 8.1 g/dL   Albumin 4.4 3.5 - 5.0 g/dL   AST 17 15 - 41 U/L   ALT 17 0 - 44 U/L   Alkaline Phosphatase 98 38 - 126 U/L   Total Bilirubin 0.2 (L) 0.3 - 1.2 mg/dL   GFR, Estimated >91>60 >47>60 mL/min    Comment: (NOTE) Calculated using the CKD-EPI Creatinine Equation (2021)    Anion gap 7 5 - 15    Comment: Performed at [facility], [address] [place], [place] [address]  Protime-INR     Status: None   Collection Time: 06/11/20  1:51 PM  Result Value Ref Range   Prothrombin Time 13.8 11.4 - 15.2 seconds   INR 1.1 0.8 - 1.2    Comment: (NOTE) INR goal varies based on device and disease states. Performed at [facility], [address] [place], [place] [address]   APTT     Status: None   Collection Time: 06/11/20  1:51 PM  Result Value Ref Range   aPTT 34 24 - 36 seconds    Comment: Performed at [facility], [address] [place], [place] [address]  Urinalysis, Routine w reflex microscopic Urine, Clean Catch     Status: Abnormal   Collection Time: 06/11/20  1:51 PM  Result Value Ref Range   Color, Urine YELLOW YELLOW   APPearance HAZY (A) CLEAR   Specific Gravity, Urine 1.023 1.005 - 1.030   pH 6.0 5.0 - 8.0   Glucose, UA NEGATIVE NEGATIVE mg/dL   Hgb urine dipstick LARGE (A) NEGATIVE   Bilirubin Urine NEGATIVE NEGATIVE   Ketones, ur NEGATIVE NEGATIVE mg/dL   Protein, ur NEGATIVE NEGATIVE mg/dL   Nitrite  NEGATIVE NEGATIVE   Leukocytes,Ua NEGATIVE NEGATIVE   RBC / HPF 0-5 0 - 5 RBC/hpf   WBC, UA 0-5 0 - 5 WBC/hpf   Bacteria, UA NONE SEEN NONE SEEN   Squamous Epithelial / LPF 6-10 0 - 5   Mucus PRESENT     Comment: Performed at [facility], [address] [place], [place] [address]  Type and screen Order type and screen if day of surgery is less than 15 days from draw of preadmission visit or order morning of surgery if day of surgery is greater than 6 days from preadmission visit.     Status: None   Collection Time: 06/11/20  1:51 PM  Result Value Ref Range   ABO/RH(D) O POS    Antibody Screen NEG    Sample Expiration 06/21/2020,2359    Extend sample reason      NO TRANSFUSIONS OR PREGNANCY IN THE PAST 3 MONTHS Performed at [facility], [address] [place], [place] [address]   SARS CORONAVIRUS 2 (TAT 6-24 HRS) Nasopharyngeal Nasopharyngeal Swab     Status: None   Collection Time: 06/15/20  1:40 PM   Specimen: Nasopharyngeal Swab  Result Value Ref Range   SARS Coronavirus 2 NEGATIVE NEGATIVE    Comment: (NOTE) SARS-CoV-2 target nucleic acids are NOT DETECTED.  The SARS-CoV-2 RNA is generally detectable in upper and lower respiratory specimens during the acute phase of infection. Negative results do not preclude SARS-CoV-2 infection, do not rule out co-infections with other pathogens, and should not be used as the sole basis for treatment or  other patient management decisions. Negative results must be combined with clinical observations, patient history, and epidemiological information. The expected result is Negative.  Fact Sheet for Patients: HairSlick.no  Fact Sheet for Healthcare Providers: quierodirigir.com  This test is not yet approved or cleared by the [place] FDA and  has been authorized for detection and/or diagnosis of SARS-CoV-2 by FDA under an Emergency Use  Authorization (EUA). This EUA will remain  in effect (meaning this test can be used) for the duration of the COVID-19 declaration under Se ction 564(b)(1) of the Act, 21 U.S.C. section 360bbb-3(b)(1), unless the authorization is terminated or revoked sooner.  Performed at [facility], [address], [place], [place] [address]   Pregnancy, urine per protocol     Status: None   Collection Time: 06/18/20  5:12 AM  Result Value Ref Range   Preg Test, Ur NEGATIVE NEGATIVE    Comment:        THE SENSITIVITY OF THIS METHODOLOGY IS >20 mIU/mL. Performed at [facility], [address], [place], [place] [address]     Estimated body mass index is 35.84 kg/m as calculated from the following:   Height as of this encounter: 5\' 4"  (1.626 m).   Weight as of this encounter: 94.7 kg.   Imaging Review Plain radiographs demonstrate severe degenerative joint disease of the right hip(s). The bone quality appears to be good for age and reported activity level.      Assessment/Plan:  End stage arthritis, right hip(s)  The patient history, physical examination, clinical judgement of the provider and imaging studies are consistent with end stage degenerative joint disease of the right hip(s) and total hip arthroplasty is deemed medically necessary. The treatment options including medical management, injection therapy, arthroscopy and arthroplasty were discussed at length. The risks and benefits of total hip arthroplasty were presented and reviewed. The risks due to aseptic loosening, infection, stiffness, dislocation/subluxation,  thromboembolic complications and other imponderables were discussed.  The patient acknowledged the explanation, agreed to proceed with the plan and consent was signed. Patient is being admitted for inpatient treatment for surgery, pain control, PT, OT, prophylactic antibiotics, VTE prophylaxis, progressive ambulation and ADL's and discharge  planning.The patient is planning to be discharged home with home health services

## 2020-06-18 NOTE — Transfer of Care (Signed)
Immediate Anesthesia Transfer of Care Note  Patient: Whitney Simpson  Procedure(s) Performed: TOTAL HIP ARTHROPLASTY ANTERIOR APPROACH (Right Hip)  Patient Location: PACU  Anesthesia Type:MAC  Level of Consciousness: sedated, patient cooperative and responds to stimulation  Airway & Oxygen Therapy: Patient Spontanous Breathing and Patient connected to face mask oxygen  Post-op Assessment: Report given to RN and Post -op Vital signs reviewed and stable  Post vital signs: Reviewed and stable  Last Vitals:  Vitals Value Taken Time  BP 112/62 06/18/20 0957  Temp    Pulse 71 06/18/20 0959  Resp 20 06/18/20 0959  SpO2 100 % 06/18/20 0959  Vitals shown include unvalidated device data.  Last Pain:  Vitals:   06/18/20 0627  TempSrc:   PainSc: 7       Patients Stated Pain Goal: 5 (22/29/79 8921)  Complications: No complications documented.

## 2020-06-18 NOTE — Anesthesia Postprocedure Evaluation (Signed)
Anesthesia Post Note  Patient: Whitney Simpson  Procedure(s) Performed: TOTAL HIP ARTHROPLASTY ANTERIOR APPROACH (Right Hip)     Patient location during evaluation: PACU Anesthesia Type: Spinal Level of consciousness: oriented and awake and alert Pain management: pain level controlled Vital Signs Assessment: post-procedure vital signs reviewed and stable Respiratory status: spontaneous breathing, respiratory function stable and nonlabored ventilation Cardiovascular status: blood pressure returned to baseline and stable Postop Assessment: no headache, no backache, no apparent nausea or vomiting, spinal receding and patient able to bend at knees Anesthetic complications: no   No complications documented.  Last Vitals:  Vitals:   06/18/20 1000 06/18/20 1015  BP: (!) 122/58 115/75  Pulse: (!) 53 (!) 56  Resp: 13 16  Temp:    SpO2: 100% 100%    Last Pain:  Vitals:   06/18/20 0957  TempSrc:   PainSc: Asleep                 Zalmen Wrightsman A.

## 2020-06-19 DIAGNOSIS — M1611 Unilateral primary osteoarthritis, right hip: Secondary | ICD-10-CM | POA: Diagnosis not present

## 2020-06-19 LAB — CBC
HCT: 34.5 % — ABNORMAL LOW (ref 36.0–46.0)
Hemoglobin: 11.3 g/dL — ABNORMAL LOW (ref 12.0–15.0)
MCH: 30.4 pg (ref 26.0–34.0)
MCHC: 32.8 g/dL (ref 30.0–36.0)
MCV: 92.7 fL (ref 80.0–100.0)
Platelets: 159 10*3/uL (ref 150–400)
RBC: 3.72 MIL/uL — ABNORMAL LOW (ref 3.87–5.11)
RDW: 13 % (ref 11.5–15.5)
WBC: 16.9 10*3/uL — ABNORMAL HIGH (ref 4.0–10.5)
nRBC: 0 % (ref 0.0–0.2)

## 2020-06-19 NOTE — Plan of Care (Signed)
  Problem: Pain Managment: Goal: General experience of comfort will improve Outcome: Progressing   Problem: Coping: Goal: Level of anxiety will decrease Outcome: Progressing   Problem: Safety: Goal: Ability to remain free from injury will improve Outcome: Progressing   

## 2020-06-19 NOTE — Progress Notes (Signed)
Physical Therapy Treatment Patient Details Name: Whitney Simpson MRN: [id number] DOB: 1990-09-13 Today's Date: 06/19/2020    History of Present Illness Patient is 30 y.o. female s/p Rt THA anterior approach on 06/18/20 with PMH significant for asthma, anemia, depression, anxiety, arthritis, pelvic fracture.    PT Comments    POD # 1 am session Assisted OOB.  General bed mobility comments: demonstared and instructed pt how to use a belt to self assist LE off bed.  Also demonstarted and instructed pt how to get into and off high bed. General transfer comment: 25% VC's on proper hand placement and increased time.  General Gait Details: cues for step to pattern and proximity to RW. pt progressed to step through. Assisted back to bed to rest and ICE.  Pt will need another PT session to address HEP. Pt progressing well to D/C to home later today.   Follow Up Recommendations  Follow surgeon's recommendation for DC plan and follow-up therapies     Equipment Recommendations  Rolling walker with 5" wheels;3in1 (PT)    Recommendations for Other Services       Precautions / Restrictions Precautions Precautions: Fall Precaution Comments: none Restrictions Weight Bearing Restrictions: No RLE Weight Bearing: Weight bearing as tolerated Other Position/Activity Restrictions: WBAT    Mobility  Bed Mobility Overal bed mobility: Needs Assistance Bed Mobility: Supine to Sit     Supine to sit: Min guard;Supervision     General bed mobility comments: demonstared and instructed pt how to use a belt to self assist LE off bed.  Also demonstarted and instructed pt how to get into and off high bed.    Transfers Overall transfer level: Needs assistance Equipment used: Rolling walker (2 wheeled) Transfers: Sit to/from Stand Sit to Stand: Supervision;Min guard         General transfer comment: 25% VC's on proper hand placement and increased time  Ambulation/Gait Ambulation/Gait assistance:  Supervision;Min guard Gait Distance (Feet): 175 Feet Assistive device: Rolling walker (2 wheeled) Gait Pattern/deviations: Step-to pattern;Decreased stride length;Step-through pattern Gait velocity: decreased   General Gait Details: cues for step to pattern and proximity to RW. pt progressed to step through.   Stairs             Wheelchair Mobility    Modified Rankin (Stroke Patients Only)       Balance                                            Cognition Arousal/Alertness: Awake/alert Behavior During Therapy: WFL for tasks assessed/performed Overall Cognitive Status: Within Functional Limits for tasks assessed                                 General Comments: AxO x 3 very pleasant and motivated      Exercises      General Comments        Pertinent Vitals/Pain Pain Assessment: 0-10 Pain Score: 5  Pain Location: Rt hip    Home Living                      Prior Function            PT Goals (current goals can now be found in the care plan section) Progress towards PT goals: Progressing toward goals  Frequency    7X/week      PT Plan Current plan remains appropriate    Co-evaluation              AM-PAC PT "6 Clicks" Mobility   Outcome Measure  Help needed turning from your back to your side while in a flat bed without using bedrails?: A Little Help needed moving from lying on your back to sitting on the side of a flat bed without using bedrails?: A Little Help needed moving to and from a bed to a chair (including a wheelchair)?: A Little Help needed standing up from a chair using your arms (e.g., wheelchair or bedside chair)?: A Little Help needed to walk in hospital room?: A Little Help needed climbing 3-5 steps with a railing? : A Little 6 Click Score: 18    End of Session Equipment Utilized During Treatment: Gait belt Activity Tolerance: Patient tolerated treatment well Patient left: in  chair;with call bell/phone within reach;with chair alarm set Nurse Communication: Mobility status PT Visit Diagnosis: Muscle weakness (generalized) (M62.81);Difficulty in walking, not elsewhere classified (R26.2)     Time: 0920-1000 PT Time Calculation (min) (ACUTE ONLY): 40 min  Charges:  $Gait Training: 8-22 mins $Therapeutic Activity: 8-22 mins $Self Care/Home Management: 8-22                     Felecia Shelling  PTA Acute  Rehabilitation Services Pager      [phone] Office      [phone]

## 2020-06-19 NOTE — Plan of Care (Signed)
Patient discharged, all care plans completed  

## 2020-06-19 NOTE — Progress Notes (Signed)
Physical Therapy Treatment Patient Details Name: Whitney Simpson MRN: [id number] DOB: 1990/05/06 Today's Date: 06/19/2020    History of Present Illness Patient is 30 y.o. female s/p Rt THA anterior approach on 06/18/20 with PMH significant for asthma, anemia, depression, anxiety, arthritis, pelvic fracture.    PT Comments    POD # 1 pm session Assisted with amb an increased distance in hallway.  Then returned to room to perform some TE's following HEP handout.  Instructed on proper tech, freq as well as use of ICE.   Addressed all mobility questions, discussed appropriate activity, educated on use of ICE.  Pt ready for D/C to home.   Follow Up Recommendations  Follow surgeon's recommendation for DC plan and follow-up therapies     Equipment Recommendations  Rolling walker with 5" wheels;3in1 (PT)    Recommendations for Other Services       Precautions / Restrictions Precautions Precautions: Fall Precaution Comments: none Restrictions Weight Bearing Restrictions: No RLE Weight Bearing: Weight bearing as tolerated Other Position/Activity Restrictions: WBAT    Mobility  Bed Mobility Overal bed mobility: Needs Assistance Bed Mobility: Supine to Sit     Supine to sit: Min guard;Supervision     General bed mobility comments: demonstared and instructed pt how to use a belt to self assist LE off bed.  Also demonstarted and instructed pt how to get into and off high bed.    Transfers Overall transfer level: Needs assistance Equipment used: Rolling walker (2 wheeled) Transfers: Sit to/from Stand Sit to Stand: Supervision;Min guard         General transfer comment: 25% VC's on proper hand placement and increased time  Ambulation/Gait Ambulation/Gait assistance: Supervision;Min guard Gait Distance (Feet): 175 Feet Assistive device: Rolling walker (2 wheeled) Gait Pattern/deviations: Step-to pattern;Decreased stride length;Step-through pattern Gait velocity: decreased    General Gait Details: cues for step to pattern and proximity to RW. pt progressed to step through.   Stairs             Wheelchair Mobility    Modified Rankin (Stroke Patients Only)       Balance                                            Cognition Arousal/Alertness: Awake/alert Behavior During Therapy: WFL for tasks assessed/performed Overall Cognitive Status: Within Functional Limits for tasks assessed                                 General Comments: AxO x 3 very pleasant and motivated      Exercises   Total Hip Replacement TE's following HEP Handout 10 reps ankle pumps 05 reps knee presses 05 reps heel slides 05 reps SAQ's 05 reps ABD Instructed how to use a belt loop to assist  Followed by ICE    General Comments        Pertinent Vitals/Pain Pain Assessment: 0-10 Pain Score: 5  Pain Location: Rt hip    Home Living                      Prior Function            PT Goals (current goals can now be found in the care plan section) Progress towards PT goals: Progressing toward goals  Frequency    7X/week      PT Plan Current plan remains appropriate    Co-evaluation              AM-PAC PT "6 Clicks" Mobility   Outcome Measure  Help needed turning from your back to your side while in a flat bed without using bedrails?: A Little Help needed moving from lying on your back to sitting on the side of a flat bed without using bedrails?: A Little Help needed moving to and from a bed to a chair (including a wheelchair)?: A Little Help needed standing up from a chair using your arms (e.g., wheelchair or bedside chair)?: A Little Help needed to walk in hospital room?: A Little Help needed climbing 3-5 steps with a railing? : A Little 6 Click Score: 18    End of Session Equipment Utilized During Treatment: Gait belt Activity Tolerance: Patient tolerated treatment well Patient left: in  chair;with call bell/phone within reach;with chair alarm set Nurse Communication: Mobility status PT Visit Diagnosis: Muscle weakness (generalized) (M62.81);Difficulty in walking, not elsewhere classified (R26.2)     Time: 1110-1135 PT Time Calculation (min) (ACUTE ONLY): 25 min  Charges:  $Gait Training: 8-22 mins $Therapeutic Exercise: 8-22 mins                      Felecia Shelling  PTA Acute  Rehabilitation Services Pager      [phone] Office      [phone]

## 2020-06-19 NOTE — Op Note (Signed)
NAME: Whitney Simpson, Whitney Simpson MEDICAL RECORD NO: [id number] ACCOUNT NO: [id number] DATE OF BIRTH: 1990-11-12 FACILITY: [facility] PHYSICIAN: Harvie Junior, MD  Operative Report   DATE OF PROCEDURE: 06/18/2020  She is a 30 year old female on orthopedic surgery service.  PREOPERATIVE DIAGNOSIS:  End-stage degenerative joint disease, right hip.  POSTOPERATIVE DIAGNOSIS:  End-stage degenerative joint disease, right hip.  PROCEDURE: 1.  Right total hip replacement through an anterior approach. 2.  Interpretation of multiple intraoperative fluoroscopic images.  SURGEON:  Harvie Junior MD  ASSISTANT:  Gus Puma, PA-C, who was present for the entire case and assisted by retraction of tissues, manipulation of the leg, and closing to minimize OR time.  BRIEF HISTORY:  The patient is a 30 year old female with a long history of having had a significant injury to her pelvis.  She had  significant pelvis surgery done and had basically an altered hemipelvis on the left side.  She unfortunately developed a  severe right hip arthritis over time.  We followed her for 5 years and tried to avoid surgical intervention, but she continued to get worse, continued to have unrelenting pain.  She was having night pain and light activity pain and inability to work and  because of failure of all conservative care, she was taken to the operating room for operative intervention.  She certainly had a significant pelvic abnormality that we needed to address intraoperatively and we were aware of this going into surgery.  DESCRIPTION OF PROCEDURE:  The patient was brought to the operating room.  After adequate anesthesia was obtained with a spinal anesthetic, the patient was placed supine on the operating table and moved on to the Hana bed.  I took significant time at  this point to get intraoperative pictures to assess her leg length. I looked at her on the bed prior to putting her to sleep and she appeared to be  about 3 or 4 mm short, so I wanted to make that up but did not want to change other things just because of  the weird relationship of her pelvis.  At that point, we got some x-rays.  We picked a point on her pelvis to try to assess her leg length and got those all set and saved prior to prepping and draping the patient.  At this point, the patient was prepped  and draped in the usual sterile fashion.  Following this, an incision was made for an anterior approach to the hip.  Subcutaneous tissue was dissected down to the level of the tensor fascia, which was identified and then divided in line with its fibers.   Once that was done, retractors were put in place above and below the femoral neck.  Following this, the capsule was opened and tagged.  Retractors were then replaced there. We had to take out significant osteophytes off the acetabulum to gain access  into the acetabulum at that point.  At that point, we exposed the acetabulum and sequentially reamed it to a level of 51 mm.  Used fluoroscopy to make sure that we had it well seated and apposed.  At that point, the cup was hammered into place.  A 52 mm  porous coated cup was hammered into place with 45 degrees of lateral opening and 30 degrees of anteversion.  Once that was completed, the neutral liner was placed and attention was then turned to the stem side.  The hip was externally rotated, adducted  and extended and then we put  retractors in place, released the posterior capsule and then we were able to get with a cookie cutter followed by chili pepper followed by sequential broaching up to a level of size 10.  Size 10 was an excellent fit.  We did  a trial reduction.  Again very difficult to assess her leg lengths, but we looked at what we had from our preoperative measurements and just wanted to make her a touch longer than what she was and so we at that point put the +8 ball on the final stem,  which we had assessed to be appropriate at the  beginning and it measured that leg length that looked to be great in terms of what we are trying to achieve, so we opened a +8 ball and put that on her stem.  This gave her excellent leg length compared to  her preoperative situation and directly measured similar to what we saw on her preoperative situation.  At this point, the wounds were irrigated and suctioned dry.  We closed the capsule with #1 Vicryl interrupted sutures.  We closed the tensor fascia  with 0 Vicryl, put Exparel throughout the wound and then closed the skin with 0 and 2-0 Vicryl and 3-0 Monocryl subcuticular.  Benzoin and Steri-Strips were applied.  Sterile compressive dressing was applied.  The patient was taken to recovery, was noted  to be in satisfactory condition.  Estimated blood loss of procedure was 400 mL. The complications were none.  We used fluoroscopy extensively throughout the case to assess the fit and fill of the stem, relationship of the acetabulum, which was complex because of her difficult anatomy.   Once this was done, the patient was taken to the recovery room, was noted to be in satisfactory condition.  Of note, Gus Puma, PA-C, was present for the entire case and assisted by retraction of the leg, manipulation of tissues and closing to minimize  OR time.   SHW D: 06/18/2020 10:02:35 am T: 06/19/2020 3:03:00 am  JOB: [id number]/ [id number]

## 2020-06-19 NOTE — Discharge Summary (Signed)
Physician Discharge Summary  Patient ID: Whitney Simpson MRN: [id number] DOB/AGE: [birth year] 28 y.o.  Admit date: 06/18/2020 Discharge date: 06/19/2020  Admission Diagnoses:  Primary osteoarthritis of right hip  Discharge Diagnoses:  Principal Problem:   Primary osteoarthritis of right hip   Past Medical History:  Diagnosis Date  . Anemia   . Anxiety   . Arthritis   . Asthma   . Depression   . No pertinent past medical history   . Pelvic fracture (HCC)     Surgeries: Procedure(s): TOTAL HIP ARTHROPLASTY ANTERIOR APPROACH on 06/18/2020   Consultants (if any):   Discharged Condition: Improved  Hospital Course: Whitney Simpson is an 30 y.o. female who was admitted 06/18/2020 with a diagnosis of Primary osteoarthritis of right hip and went to the operating room on 06/18/2020 and underwent the above named procedures.    She was given perioperative antibiotics:  Anti-infectives (From admission, onward)   Start     Dose/Rate Route Frequency Ordered Stop   06/18/20 2000  vancomycin (VANCOREADY) IVPB 1000 mg/200 mL        1,000 mg 200 mL/hr over 60 Minutes Intravenous Every 12 hours 06/18/20 1152 06/18/20 2223   06/18/20 0600  vancomycin (VANCOCIN) IVPB 1000 mg/200 mL premix        1,000 mg 200 mL/hr over 60 Minutes Intravenous On call to O.R. 06/18/20 0932 06/18/20 0729    .  She was given sequential compression devices, early ambulation for DVT prophylaxis.  She benefited maximally from the hospital stay and there were no complications.  On the morning of d/c, she was tol PO, pain reasonably controlled, and had been ambulating with a walker with PT.  She had intact LT sens plantar/dorsum/1st web, but diminished LT sens at lateral thigh.  Ankle and toe DF/PF was intact.  She was d/c home after clearing PT.  Recent vital signs:  Vitals:   06/19/20 0115 06/19/20 0500  BP: 128/67 139/70  Pulse: 63 74  Resp: 17 17  Temp: 99.2 F (37.3 C) 99 F (37.2 C)  SpO2: 95% 93%     Recent laboratory studies:  Lab Results  Component Value Date   HGB 11.3 (L) 06/19/2020   HGB 12.8 06/11/2020   HGB 12.5 04/28/2019   Lab Results  Component Value Date   WBC 16.9 (H) 06/19/2020   PLT 159 06/19/2020   Lab Results  Component Value Date   INR 1.1 06/11/2020   Lab Results  Component Value Date   NA 146 (H) 06/11/2020   K 4.1 06/11/2020   CL 113 (H) 06/11/2020   CO2 26 06/11/2020   BUN 10 06/11/2020   CREATININE 0.66 06/11/2020   GLUCOSE 95 06/11/2020    Discharge Medications:   Allergies as of 06/19/2020      Reactions   Penicillins Anaphylaxis, Hives   Throat closing and hives      Medication List    STOP taking these medications   hydrOXYzine 25 MG tablet Commonly known as: ATARAX/VISTARIL   sertraline 50 MG tablet Commonly known as: ZOLOFT   traZODone 50 MG tablet Commonly known as: DESYREL     TAKE these medications   aspirin EC 325 MG tablet Take 1 tablet (325 mg total) by mouth 2 (two) times daily after a meal. Take x 1 month post op to decrease risk of blood clots.   docusate sodium 100 MG capsule Commonly known as: Colace Take 1 capsule (100 mg total) by mouth 2 (two) times  daily.   oxyCODONE-acetaminophen 5-325 MG tablet Commonly known as: PERCOCET/ROXICET Take 1-2 tablets by mouth every 6 (six) hours as needed for severe pain.   tiZANidine 2 MG tablet Commonly known as: ZANAFLEX Take 1 tablet (2 mg total) by mouth every 8 (eight) hours as needed for muscle spasms.            Durable Medical Equipment  (From admission, onward)         Start     Ordered   06/18/20 1153  DME Walker rolling  Once       Question:  Patient needs a walker to treat with the following condition  Answer:  Primary osteoarthritis of right hip   06/18/20 1152   06/18/20 1153  DME 3 n 1  Once        06/18/20 1152          Diagnostic Studies: DG Chest 2 View  Result Date: 06/11/2020 CLINICAL DATA:  Preoperative for hip replacement.  EXAM: CHEST - 2 VIEW COMPARISON:  01/22/2009. FINDINGS: The heart size and mediastinal contours are within normal limits. Both lungs are clear. No visible pleural effusions or pneumothorax. No acute osseous abnormality. IMPRESSION: No active cardiopulmonary disease. Electronically Signed   By: Feliberto Harts MD   On: 06/11/2020 18:09   DG C-Arm 1-60 Min-No Report  Result Date: 06/18/2020 Fluoroscopy was utilized by the requesting physician.  No radiographic interpretation.   DG HIP OPERATIVE UNILAT W OR W/O PELVIS RIGHT  Result Date: 06/18/2020 CLINICAL DATA:  Right anterior hip replacement. EXAM: OPERATIVE RIGHT HIP (WITH PELVIS IF PERFORMED) 4 VIEWS TECHNIQUE: Fluoroscopic spot image(s) were submitted for interpretation post-operatively. COMPARISON:  June 07, 2014. FINDINGS: Four C-arm fluoroscopic images were obtained intraoperatively and submitted for post operative interpretation. These images demonstrate postsurgical changes of right total hip arthroplasty. Partially imaged remote superior and inferior pubic rami fractures and left iliac postsurgical changes. Please see the performing provider's procedural report for further detail. IMPRESSION: Right total hip arthroplasty. Electronically Signed   By: Feliberto Harts MD   On: 06/18/2020 10:36    Disposition:      Follow-up Information    Whitney Geralds, MD. Schedule an appointment as soon as possible for a visit in 2 weeks.   Specialty: Orthopedic Surgery Contact information: [address] [place] [address] [phone]                Signed: Jodi Simpson 06/19/2020, 9:28 AM

## 2020-06-19 NOTE — Progress Notes (Signed)
Patient verbalized understanding of discharge instructions. Patient now has DME walker and 3n1 commode. Spoke to person who delivers the DME equipment, he informed patient that the shower bench will be sent to her house.

## 2020-06-19 NOTE — Plan of Care (Signed)
  Problem: Education: Goal: Knowledge of General Education information will improve Description: Including pain rating scale, medication(s)/side effects and non-pharmacologic comfort measures Outcome: Progressing   Problem: Activity: Goal: Risk for activity intolerance will decrease Outcome: Progressing   Problem: Pain Managment: Goal: General experience of comfort will improve Outcome: Progressing   

## 2020-06-19 NOTE — TOC Transition Note (Signed)
Transition of Care [facility]) - CM/SW Discharge Note   Patient Details  Name: Whitney Simpson MRN: [id number] Date of Birth: September 24, 1990  [facility]) CM/SW Contact:  Kallie Locks, RN Phone Number: 06/19/2020, 11:50 AM   Clinical Narrative:   Spoke with patient. [facility] was already prearranged for home health services. Writer contacted [employer] with [employer] to request 3 in 1, rolling walker and tub bench to be delivered to room.   Whitney Simpson lives with child. Her mother is going to stay with her to assist post hospitalization.   Has PCP follow up with [facility] on 06/21/20.  No further needs assessed.    Final next level of care: Home w Home Health Services Barriers to Discharge: No Barriers Identified   Patient Goals and CMS Choice Patient states their goals for this hospitalization and ongoing recovery are:: return home CMS Medicare.gov Compare Post Acute Care list provided to:: Patient    Discharge Placement                       Discharge Plan and Services                DME Arranged: 3-N-1,Walker rolling,Tub bench   Date DME Agency Contacted: 06/19/20 Time DME Agency Contacted: 1115 Representative spoke with at DME Agency: Vaughan Basta with [employer]            Social Determinants of Health (SDOH) Interventions     Readmission Risk Interventions No flowsheet data found.

## 2020-06-21 ENCOUNTER — Encounter (HOSPITAL_COMMUNITY): Payer: Self-pay | Admitting: Orthopedic Surgery

## 2020-07-05 ENCOUNTER — Encounter (HOSPITAL_COMMUNITY): Payer: Self-pay | Admitting: *Deleted

## 2020-07-05 ENCOUNTER — Emergency Department (HOSPITAL_COMMUNITY): Payer: Medicaid Other

## 2020-07-05 ENCOUNTER — Emergency Department (HOSPITAL_COMMUNITY)
Admission: EM | Admit: 2020-07-05 | Discharge: 2020-07-05 | Disposition: A | Discharge status: Home / Self Care | Payer: Medicaid Other | Attending: Emergency Medicine | Admitting: Emergency Medicine

## 2020-07-05 DIAGNOSIS — J45909 Unspecified asthma, uncomplicated: Secondary | ICD-10-CM | POA: Insufficient documentation

## 2020-07-05 DIAGNOSIS — S32421A Displaced fracture of posterior wall of right acetabulum, initial encounter for closed fracture: Secondary | ICD-10-CM | POA: Diagnosis not present

## 2020-07-05 DIAGNOSIS — W010XXA Fall on same level from slipping, tripping and stumbling without subsequent striking against object, initial encounter: Secondary | ICD-10-CM | POA: Insufficient documentation

## 2020-07-05 DIAGNOSIS — M545 Low back pain, unspecified: Secondary | ICD-10-CM | POA: Insufficient documentation

## 2020-07-05 DIAGNOSIS — Z7982 Long term (current) use of aspirin: Secondary | ICD-10-CM | POA: Diagnosis not present

## 2020-07-05 DIAGNOSIS — Z96641 Presence of right artificial hip joint: Secondary | ICD-10-CM | POA: Diagnosis not present

## 2020-07-05 DIAGNOSIS — S79911A Unspecified injury of right hip, initial encounter: Secondary | ICD-10-CM | POA: Diagnosis present

## 2020-07-05 DIAGNOSIS — S32424A Nondisplaced fracture of posterior wall of right acetabulum, initial encounter for closed fracture: Secondary | ICD-10-CM

## 2020-07-05 MED ORDER — OXYCODONE-ACETAMINOPHEN 5-325 MG PO TABS: 1.0000 | ORAL_TABLET | Freq: Four times a day (QID) | ORAL | 0 refills | Status: DC | PRN

## 2020-07-05 MED ORDER — HYDROMORPHONE HCL 1 MG/ML IJ SOLN
1.0000 mg | Freq: Once | INTRAMUSCULAR | Status: AC
  Administered 2020-07-05: 1 mg via INTRAMUSCULAR
  Filled 2020-07-05: qty 1

## 2020-07-05 MED ORDER — DIAZEPAM 5 MG PO TABS
5.0000 mg | ORAL_TABLET | Freq: Once | ORAL | Status: AC
  Administered 2020-07-05: 5 mg via ORAL
  Filled 2020-07-05: qty 1

## 2020-07-05 MED ORDER — KETOROLAC TROMETHAMINE 60 MG/2ML IM SOLN
60.0000 mg | Freq: Once | INTRAMUSCULAR | Status: AC
  Administered 2020-07-05: 60 mg via INTRAMUSCULAR
  Filled 2020-07-05: qty 2

## 2020-07-05 NOTE — ED Notes (Signed)
Pt taken to Xray.

## 2020-07-05 NOTE — ED Notes (Signed)
Pt started yelling that "my phone is missing". Went to bedside to assist pt and pt found phone under her. Pt then called this RN "an asshole" for not helping her, and stated "if I was white, you'd help me". Pt's RN made aware

## 2020-07-05 NOTE — ED Notes (Signed)
Pt in CT at this time.

## 2020-07-05 NOTE — ED Provider Notes (Signed)
[facility]-EMERGENCY DEPT Provider Note   CSN: [id number] Arrival date & time: 07/05/20  0901     History Chief Complaint  Patient presents with  . Hip Pain    Whitney Simpson is a 30 y.o. female.  HPI      30 year old female with history of recent right total hip replacement for end-stage degenerative joint disease of the right hip by Dr. Luiz Blare on 06/18/2020 who presents with concern for fall and right hip pain.   Was going up the stairs and stair shifted and fell, falling onto her right side. Immediately had severe pain to the right hip.  Reports pulling right buttock pain radiating downwards as well as low back pain. [facility]ame [facility].  Has not been doing therapy after hip surgery, has been doing ok. Reports walking without the walker at home but does have it.   Denies head trauma, headache, neck pain, numbness, weakness, chest pain, abdominal pain.     Past Medical History:  Diagnosis Date  . Anemia   . Anxiety   . Arthritis   . Asthma   . Depression   . No pertinent past medical history   . Pelvic fracture [facility])     Patient Active Problem List   Diagnosis Date Noted  . Primary osteoarthritis of right hip 06/18/2020  . Intrauterine pregnancy 04/28/2019  . Subchorionic hematoma in first trimester 04/28/2019  . Anxiety state, unspecified 04/19/2013  . MDD (major depressive disorder), recurrent episode (HCC) 04/18/2013    Past Surgical History:  Procedure Laterality Date  . BONY PELVIS SURGERY    . CESAREAN SECTION    . COLOSTOMY    . TOTAL HIP ARTHROPLASTY Right 06/18/2020   Procedure: TOTAL HIP ARTHROPLASTY ANTERIOR APPROACH;  Surgeon: Jodi Geralds, MD;  Location: [facility];  Service: Orthopedics;  Laterality: Right;     OB History    Gravida  3   Para  2   Term  2   Preterm  0   AB  0   Living  2     SAB  0   IAB  0   Ectopic  0   Multiple  0   Live Births  2           Family History  Problem Relation Age of Onset   . Diabetes Other   . Hypertension Other   . Anesthesia problems Neg Hx   . Hypotension Neg Hx   . Malignant hyperthermia Neg Hx   . Pseudochol deficiency Neg Hx     Social History   Tobacco Use  . Smoking status: Never Smoker  . Smokeless tobacco: Never Used  Vaping Use  . Vaping Use: Never used  Substance Use Topics  . Alcohol use: Yes    Comment: ocass.  . Drug use: Not Currently    Types: Marijuana    Home Medications Prior to Admission medications   Medication Sig Start Date End Date Taking? Authorizing Provider  oxyCODONE-acetaminophen (PERCOCET/ROXICET) 5-325 MG tablet Take 1-2 tablets by mouth every 6 (six) hours as needed for severe pain. 07/05/20  Yes Alvira Monday, MD  aspirin EC 325 MG tablet Take 1 tablet (325 mg total) by mouth 2 (two) times daily after a meal. Take x 1 month post op to decrease risk of blood clots. 06/18/20   Marshia Ly, PA-C  docusate sodium (COLACE) 100 MG capsule Take 1 capsule (100 mg total) by mouth 2 (two) times daily. 06/18/20   Orma Flaming,  Fayrene Fearing, PA-C  tiZANidine (ZANAFLEX) 2 MG tablet Take 1 tablet (2 mg total) by mouth every 8 (eight) hours as needed for muscle spasms. 06/18/20   Marshia Ly, PA-C    Allergies    Penicillins  Review of Systems   Review of Systems  Constitutional: Negative for fever.  Respiratory: Negative for shortness of breath.   Cardiovascular: Negative for chest pain.  Gastrointestinal: Negative for abdominal pain, nausea and vomiting.  Musculoskeletal: Positive for arthralgias, back pain and gait problem. Negative for neck pain.  Neurological: Negative for weakness, numbness and headaches.    Physical Exam Updated Vital Signs BP (!) 118/58   Pulse (!) 52   Temp 98 F (36.7 C) (Oral)   Resp 18   LMP 06/21/2020   SpO2 99%   Physical Exam Vitals and nursing note reviewed.  Constitutional:      General: She is not in acute distress.    Appearance: She is well-developed. She is not diaphoretic.   HENT:     Head: Normocephalic and atraumatic.  Eyes:     Conjunctiva/sclera: Conjunctivae normal.  Cardiovascular:     Rate and Rhythm: Normal rate and regular rhythm.  Pulmonary:     Effort: Pulmonary effort is normal. No respiratory distress.  Abdominal:     General: There is no distension.     Palpations: Abdomen is soft.     Tenderness: There is no abdominal tenderness. There is no guarding.  Musculoskeletal:        General: Tenderness (right hip, gluteal, low right side of back tenderness) present.     Cervical back: Normal range of motion.  Skin:    General: Skin is warm and dry.     Findings: No erythema or rash.  Neurological:     Mental Status: She is alert and oriented to person, place, and time.     ED Results / Procedures / Treatments   Labs (all labs ordered are listed, but only abnormal results are displayed) Labs Reviewed - No data to display  EKG None  Radiology DG Lumbar Spine Complete  Result Date: 07/05/2020 CLINICAL DATA:  Back pain, fall today EXAM: LUMBAR SPINE - COMPLETE 4+ VIEW COMPARISON:  None. FINDINGS: There is no evidence of lumbar spine fracture. Plate and screw fixation of the left ilium. Alignment is normal. Intervertebral disc spaces are maintained. Partially imaged right hip arthroplasty. IMPRESSION: 1. No fracture or dislocation of the lumbar spine. Disc spaces and vertebral body heights are preserved. 2.  Plate and screw fixation of the left ilium. 3.  Partially imaged right hip arthroplasty. Electronically Signed   By: Lauralyn Primes M.D.   On: 07/05/2020 11:37   CT Hip Right Wo Contrast  Result Date: 07/05/2020 CLINICAL DATA:  Hip trauma, fracture suspected, neg xray, patient fell today, difficulty walking EXAM: CT OF THE RIGHT HIP WITHOUT CONTRAST TECHNIQUE: Multidetector CT imaging of the right hip was performed according to the standard protocol. Multiplanar CT image reconstructions were also generated. COMPARISON:  Same day radiograph  07/05/2020, radiograph 06/18/2020 FINDINGS: Bones/Joint/Cartilage There is a right total hip arthroplasty which is in normal alignment. There is a posterior acetabular wall fracture, nondisplaced (axial image 43). There is no fracture of the proximal femur. There are chronic fractures of the anterior acetabulum, superior and inferior pubic rami. Chronic left pubic bone fracture partially visualized. Ligaments Suboptimally assessed by CT. Muscles and Tendons No significant atrophy.  No abnormality identified. Soft tissues Recent anterior incision with adjacent stranding. IMPRESSION: Nondisplaced,  acute right posterior acetabular wall fracture. Maintained alignment of the right hip arthroplasty without evidence of proximal femur fracture Chronic anterior acetabular, right superior and inferior pubic rami injury. Electronically Signed   By: Caprice Renshaw   On: 07/05/2020 13:58   DG Hip Unilat W or Wo Pelvis 2-3 Views Right  Result Date: 07/05/2020 CLINICAL DATA:  Fall.  Right hip pain. EXAM: DG HIP (WITH OR WITHOUT PELVIS) 2-3V RIGHT COMPARISON:  None. FINDINGS: Posttraumatic deformity noted both superior and inferior pubic rami with evidence of fixation hardware over the left pelvis. Status post right hip replacement. No evidence for periprosthetic right femoral fracture. IMPRESSION: No acute bony abnormality. Electronically Signed   By: Kennith Center M.D.   On: 07/05/2020 10:40    Procedures Procedures   Medications Ordered in ED Medications  HYDROmorphone (DILAUDID) injection 1 mg (1 mg Intramuscular Given 07/05/20 1047)  ketorolac (TORADOL) injection 60 mg (60 mg Intramuscular Given 07/05/20 1215)  diazepam (VALIUM) tablet 5 mg (5 mg Oral Given 07/05/20 1215)  HYDROmorphone (DILAUDID) injection 1 mg (1 mg Intramuscular Given 07/05/20 1455)    ED Course  I have reviewed the triage vital signs and the nursing notes.  Pertinent labs & imaging results that were available during my care of the patient  were reviewed by me and considered in my medical decision making (see chart for details).    MDM Rules/Calculators/A&P                           30 year old female with history of recent right total hip replacement for end-stage degenerative joint disease of the right hip by Dr. Luiz Blare on 06/18/2020 who presents with concern for fall and right hip pain.   XR hip and lumbar spine without abnormalities.  Attempted to stand however with significant pain. Discussed with Dr. Luiz Blare and CT ordered. CT shows posterior acetabular fx--Dr. Luiz Blare reviewed CT imaging recommends given location she is ok for weight bearing as tolerated with walker and follow up as outpatient.  Reviewed in [place] drug database and given rx for percocet for acute fx   Patient discharged in stable condition with understanding of reasons to return.   Final Clinical Impression(s) / ED Diagnoses Final diagnoses:  Closed nondisplaced fracture of posterior wall of right acetabulum, initial encounter [facility])    Rx / DC Orders ED Discharge Orders         Ordered    oxyCODONE-acetaminophen (PERCOCET/ROXICET) 5-325 MG tablet  Every 6 hours PRN        07/05/20 1612           Alvira Monday, MD 07/05/20 2239

## 2020-07-05 NOTE — Discharge Instructions (Signed)
You may remain weight bearing as tolerated using your walker for this fracture per Dr. Luiz Blare and follow up with him this week.  You can also call his office if you have additional concerns regarding this pain.  It was a pleasure taking care of you!

## 2020-07-05 NOTE — ED Triage Notes (Addendum)
Pt complains of right hip pain since falling this morning. She had right hip replacement 2 weeks ago. No head injury or loss of consciousness.

## 2020-12-18 ENCOUNTER — Other Ambulatory Visit: Payer: Self-pay

## 2020-12-18 ENCOUNTER — Emergency Department (HOSPITAL_COMMUNITY): Payer: Medicaid Other

## 2020-12-18 ENCOUNTER — Encounter (HOSPITAL_COMMUNITY): Payer: Self-pay

## 2020-12-18 ENCOUNTER — Emergency Department (HOSPITAL_COMMUNITY)
Admission: EM | Admit: 2020-12-18 | Discharge: 2020-12-18 | Disposition: A | Discharge status: Home / Self Care | Payer: Medicaid Other | Attending: Emergency Medicine | Admitting: Emergency Medicine

## 2020-12-18 DIAGNOSIS — Z96641 Presence of right artificial hip joint: Secondary | ICD-10-CM | POA: Insufficient documentation

## 2020-12-18 DIAGNOSIS — R42 Dizziness and giddiness: Secondary | ICD-10-CM | POA: Diagnosis not present

## 2020-12-18 DIAGNOSIS — M7918 Myalgia, other site: Secondary | ICD-10-CM

## 2020-12-18 DIAGNOSIS — R109 Unspecified abdominal pain: Secondary | ICD-10-CM | POA: Diagnosis not present

## 2020-12-18 DIAGNOSIS — J45909 Unspecified asthma, uncomplicated: Secondary | ICD-10-CM | POA: Diagnosis not present

## 2020-12-18 DIAGNOSIS — Y92410 Unspecified street and highway as the place of occurrence of the external cause: Secondary | ICD-10-CM | POA: Diagnosis not present

## 2020-12-18 DIAGNOSIS — R0789 Other chest pain: Secondary | ICD-10-CM | POA: Diagnosis not present

## 2020-12-18 DIAGNOSIS — R519 Headache, unspecified: Secondary | ICD-10-CM | POA: Insufficient documentation

## 2020-12-18 DIAGNOSIS — Z7982 Long term (current) use of aspirin: Secondary | ICD-10-CM | POA: Insufficient documentation

## 2020-12-18 LAB — CBC WITH DIFFERENTIAL/PLATELET
Abs Immature Granulocytes: 0.03 10*3/uL (ref 0.00–0.07)
Basophils Absolute: 0 10*3/uL (ref 0.0–0.1)
Basophils Relative: 0 %
Eosinophils Absolute: 0.2 10*3/uL (ref 0.0–0.5)
Eosinophils Relative: 2 %
HCT: 38.7 % (ref 36.0–46.0)
Hemoglobin: 12.4 g/dL (ref 12.0–15.0)
Immature Granulocytes: 0 %
Lymphocytes Relative: 23 %
Lymphs Abs: 1.9 10*3/uL (ref 0.7–4.0)
MCH: 29.2 pg (ref 26.0–34.0)
MCHC: 32 g/dL (ref 30.0–36.0)
MCV: 91.3 fL (ref 80.0–100.0)
Monocytes Absolute: 0.5 10*3/uL (ref 0.1–1.0)
Monocytes Relative: 6 %
Neutro Abs: 5.8 10*3/uL (ref 1.7–7.7)
Neutrophils Relative %: 69 %
Platelets: 193 10*3/uL (ref 150–400)
RBC: 4.24 MIL/uL (ref 3.87–5.11)
RDW: 17.2 % — ABNORMAL HIGH (ref 11.5–15.5)
WBC: 8.6 10*3/uL (ref 4.0–10.5)
nRBC: 0 % (ref 0.0–0.2)

## 2020-12-18 LAB — BASIC METABOLIC PANEL
Anion gap: 4 — ABNORMAL LOW (ref 5–15)
BUN: 9 mg/dL (ref 6–20)
CO2: 28 mmol/L (ref 22–32)
Calcium: 9 mg/dL (ref 8.9–10.3)
Chloride: 110 mmol/L (ref 98–111)
Creatinine, Ser: 0.74 mg/dL (ref 0.44–1.00)
GFR, Estimated: >60 mL/min (ref >60)
Glucose, Bld: 108 mg/dL — ABNORMAL HIGH (ref 70–99)
Potassium: 3.6 mmol/L (ref 3.5–5.1)
Sodium: 142 mmol/L (ref 135–145)

## 2020-12-18 LAB — I-STAT BETA HCG BLOOD, ED (MC, WL, AP ONLY): I-stat hCG, quantitative: <5 m[IU]/mL (ref <5)

## 2020-12-18 MED ORDER — NAPROXEN 500 MG PO TABS: 500.0000 mg | ORAL_TABLET | Freq: Two times a day (BID) | ORAL | 0 refills | Status: AC

## 2020-12-18 MED ORDER — ONDANSETRON 8 MG PO TBDP
8.0000 mg | ORAL_TABLET | Freq: Once | ORAL | Status: AC
Start: 2020-12-18 — End: 2020-12-18
  Administered 2020-12-18: 8 mg via ORAL
  Filled 2020-12-18: qty 1

## 2020-12-18 MED ORDER — METHOCARBAMOL 500 MG PO TABS: 500.0000 mg | ORAL_TABLET | Freq: Two times a day (BID) | ORAL | 0 refills | Status: DC

## 2020-12-18 MED ORDER — IOHEXOL 350 MG/ML SOLN
80.0000 mL | Freq: Once | INTRAVENOUS | Status: AC | PRN
  Administered 2020-12-18: 80 mL via INTRAVENOUS

## 2020-12-18 MED ORDER — OXYCODONE-ACETAMINOPHEN 5-325 MG PO TABS
1.0000 | ORAL_TABLET | Freq: Once | ORAL | Status: AC
  Administered 2020-12-18: 1 via ORAL
  Filled 2020-12-18: qty 1
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
## 2020-12-18 NOTE — Discharge Instructions (Addendum)

## 2021-03-29 ENCOUNTER — Emergency Department (HOSPITAL_COMMUNITY)
Admission: EM | Admit: 2021-03-29 | Discharge: 2021-03-29 | Disposition: A | Discharge status: Home / Self Care | Payer: Medicaid Other | Attending: Emergency Medicine | Admitting: Emergency Medicine

## 2021-03-29 DIAGNOSIS — Z5321 Procedure and treatment not carried out due to patient leaving prior to being seen by health care provider: Secondary | ICD-10-CM | POA: Diagnosis not present

## 2021-03-29 DIAGNOSIS — R109 Unspecified abdominal pain: Secondary | ICD-10-CM | POA: Diagnosis present

## 2021-03-29 LAB — CBC WITH DIFFERENTIAL/PLATELET
Abs Immature Granulocytes: 0.03 10*3/uL (ref 0.00–0.07)
Basophils Absolute: 0 10*3/uL (ref 0.0–0.1)
Basophils Relative: 1 %
Eosinophils Absolute: 0.1 10*3/uL (ref 0.0–0.5)
Eosinophils Relative: 2 %
HCT: 39.4 % (ref 36.0–46.0)
Hemoglobin: 12.6 g/dL (ref 12.0–15.0)
Immature Granulocytes: 0 %
Lymphocytes Relative: 22 %
Lymphs Abs: 1.5 10*3/uL (ref 0.7–4.0)
MCH: 30.9 pg (ref 26.0–34.0)
MCHC: 32 g/dL (ref 30.0–36.0)
MCV: 96.6 fL (ref 80.0–100.0)
Monocytes Absolute: 0.5 10*3/uL (ref 0.1–1.0)
Monocytes Relative: 7 %
Neutro Abs: 4.7 10*3/uL (ref 1.7–7.7)
Neutrophils Relative %: 68 %
Platelets: 202 10*3/uL (ref 150–400)
RBC: 4.08 MIL/uL (ref 3.87–5.11)
RDW: 12.8 % (ref 11.5–15.5)
WBC: 6.9 10*3/uL (ref 4.0–10.5)
nRBC: 0 % (ref 0.0–0.2)

## 2021-03-29 LAB — LIPASE, BLOOD: Lipase: 43 U/L (ref 11–51)

## 2021-03-29 LAB — COMPREHENSIVE METABOLIC PANEL
ALT: 15 U/L (ref 0–44)
AST: 15 U/L (ref 15–41)
Albumin: 3.2 g/dL — ABNORMAL LOW (ref 3.5–5.0)
Alkaline Phosphatase: 74 U/L (ref 38–126)
Anion gap: 7 (ref 5–15)
BUN: 6 mg/dL (ref 6–20)
CO2: 25 mmol/L (ref 22–32)
Calcium: 9 mg/dL (ref 8.9–10.3)
Chloride: 110 mmol/L (ref 98–111)
Creatinine, Ser: 0.69 mg/dL (ref 0.44–1.00)
GFR, Estimated: >60 mL/min (ref >60)
Glucose, Bld: 88 mg/dL (ref 70–99)
Potassium: 4 mmol/L (ref 3.5–5.1)
Sodium: 142 mmol/L (ref 135–145)
Total Bilirubin: 0.5 mg/dL (ref 0.3–1.2)
Total Protein: 5.6 g/dL — ABNORMAL LOW (ref 6.5–8.1)

## 2021-03-29 LAB — I-STAT BETA HCG BLOOD, ED (MC, WL, AP ONLY): I-stat hCG, quantitative: <5 m[IU]/mL (ref <5)

## 2021-03-29 MED ORDER — LIDOCAINE VISCOUS HCL 2 % MT SOLN
15.0000 mL | Freq: Once | OROMUCOSAL | Status: AC
  Administered 2021-03-29: 15 mL via ORAL
  Filled 2021-03-29: qty 15

## 2021-03-29 MED ORDER — ALUM & MAG HYDROXIDE-SIMETH 200-200-20 MG/5ML PO SUSP
30.0000 mL | Freq: Once | ORAL | Status: AC
  Administered 2021-03-29: 30 mL via ORAL
  Filled 2021-03-29: qty 30

## 2021-03-29 NOTE — ED Notes (Signed)
Pt called x3 for vital check no answer, will try again in 5 mins.

## 2021-03-29 NOTE — ED Notes (Signed)
Pt was called again x3 no answer or is pt outside.

## 2021-03-29 NOTE — ED Triage Notes (Signed)
Pt. Stated, I think I have a stomach ulcer, Im having on and off stomach pain bad.

## 2021-03-29 NOTE — ED Provider Triage Note (Signed)
Emergency Medicine Provider Triage Evaluation Note  Whitney Simpson , a 31 y.o. female  was evaluated in triage.  Pt complains of left upper quadrant abdominal pain.  Patient reports the pain started yesterday evening and has been constant since then.  Patient is concerned this is due to ulcers.  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  Patient states that she does not take any proton pump inhibitor on a regular basis.  Patient denies frequent coffee, ibuprofen, or alcohol use.  Additionally patient endorses constipation.  States his last bowel movement was yesterday.  LMP 3 to 4 weeks prior.  Patient reports C-section x2.  Review of Systems  Positive: Left upper quadrant abdominal pain, constipation Negative: Fever, chills, nausea, vomiting, diarrhea, blood in stool, melena, dysuria, hematuria, urinary urgency, vaginal pain, vaginal bleeding, vaginal discharge, pelvic pain  Physical Exam  BP 118/68 (BP Location: Right Arm)    Pulse 61    Temp 98.7 F (37.1 C) (Oral)    Resp 16    LMP 02/23/2021    SpO2 98%  Gen:   Awake, no distress   Resp:  Normal effort  MSK:   Moves extremities without difficulty  Other:  Abdomen soft, nondistended, tenderness to left upper quadrant.  No guarding or rebound tenderness.  Medical Decision Making  Medically screening exam initiated at 12:43 PM.  Appropriate orders placed.  Karie Chimera was informed that the remainder of the evaluation will be completed by another provider, this initial triage assessment does not replace that evaluation, and the importance of remaining in the ED until their evaluation is complete.     Loni Beckwith, [place] 03/29/21 1245

## 2021-04-28 ENCOUNTER — Other Ambulatory Visit: Payer: Self-pay

## 2021-04-28 ENCOUNTER — Emergency Department (HOSPITAL_COMMUNITY): Payer: Medicaid Other

## 2021-04-28 ENCOUNTER — Emergency Department (HOSPITAL_COMMUNITY)
Admission: EM | Admit: 2021-04-28 | Discharge: 2021-04-28 | Disposition: A | Discharge status: Home / Self Care | Payer: Medicaid Other | Attending: Emergency Medicine | Admitting: Emergency Medicine

## 2021-04-28 ENCOUNTER — Encounter (HOSPITAL_COMMUNITY): Payer: Self-pay | Admitting: *Deleted

## 2021-04-28 DIAGNOSIS — R739 Hyperglycemia, unspecified: Secondary | ICD-10-CM | POA: Insufficient documentation

## 2021-04-28 DIAGNOSIS — J45909 Unspecified asthma, uncomplicated: Secondary | ICD-10-CM | POA: Insufficient documentation

## 2021-04-28 DIAGNOSIS — R0789 Other chest pain: Secondary | ICD-10-CM | POA: Diagnosis not present

## 2021-04-28 DIAGNOSIS — Z7982 Long term (current) use of aspirin: Secondary | ICD-10-CM | POA: Insufficient documentation

## 2021-04-28 DIAGNOSIS — R079 Chest pain, unspecified: Secondary | ICD-10-CM | POA: Diagnosis present

## 2021-04-28 LAB — CBC
HCT: 48.6 % — ABNORMAL HIGH (ref 36.0–46.0)
Hemoglobin: 15.9 g/dL — ABNORMAL HIGH (ref 12.0–15.0)
MCH: 30.9 pg (ref 26.0–34.0)
MCHC: 32.7 g/dL (ref 30.0–36.0)
MCV: 94.6 fL (ref 80.0–100.0)
Platelets: 204 10*3/uL (ref 150–400)
RBC: 5.14 MIL/uL — ABNORMAL HIGH (ref 3.87–5.11)
RDW: 13.1 % (ref 11.5–15.5)
WBC: 8 10*3/uL (ref 4.0–10.5)
nRBC: 0 % (ref 0.0–0.2)

## 2021-04-28 LAB — BASIC METABOLIC PANEL
Anion gap: 5 (ref 5–15)
BUN: 9 mg/dL (ref 6–20)
CO2: 26 mmol/L (ref 22–32)
Calcium: 8.8 mg/dL — ABNORMAL LOW (ref 8.9–10.3)
Chloride: 107 mmol/L (ref 98–111)
Creatinine, Ser: 0.65 mg/dL (ref 0.44–1.00)
GFR, Estimated: >60 mL/min (ref >60)
Glucose, Bld: 100 mg/dL — ABNORMAL HIGH (ref 70–99)
Potassium: 3.8 mmol/L (ref 3.5–5.1)
Sodium: 138 mmol/L (ref 135–145)

## 2021-04-28 LAB — I-STAT BETA HCG BLOOD, ED (MC, WL, AP ONLY): I-stat hCG, quantitative: <5 m[IU]/mL (ref <5)

## 2021-04-28 LAB — TROPONIN I (HIGH SENSITIVITY): Troponin I (High Sensitivity): <2 ng/L (ref <18)

## 2021-04-28 MED ORDER — KETOROLAC TROMETHAMINE 15 MG/ML IJ SOLN
15.0000 mg | Freq: Once | INTRAMUSCULAR | Status: AC
  Administered 2021-04-28: 13:00:00 15 mg via INTRAMUSCULAR
  Filled 2021-04-28: qty 1

## 2021-04-28 MED ORDER — KETOROLAC TROMETHAMINE 15 MG/ML IJ SOLN: 15.0000 mg | Freq: Once | INTRAMUSCULAR | Status: DC

## 2021-04-28 NOTE — ED Notes (Signed)
Juice and crackers given at pts request ?

## 2021-04-28 NOTE — ED Notes (Signed)
Pt to xray

## 2021-04-28 NOTE — ED Provider Notes (Signed)
?[facility]-EMERGENCY DEPT ?Provider Note ? ? ?CSN: [id number] ?Arrival date & time: 04/28/21  1128 ? ?  ? ?History ? ?Chief Complaint  ?Patient presents with  ? Chest Pain  ? ? ?Whitney Simpson is a 31 y.o. female. ? ?31 year old female presents today for evaluation of chest pain.  She states she donated plasma today.  Following donating plasma she developed chest pain.  She states she donates plasma often.  She also reports she has history of similar episodes of chest pain but never after donating plasma.  She states typically this pain last for hours and goes away after she lays down.  She denies shortness of breath, palpitations, radiation of her chest pain, lightheadedness, or diaphoresis.  Denies acid reflux-like symptoms.  She is unsure what brings on the pain.  Denies prior history of cardiac problems.  No family cardiac history.  Does not take daily medications. ? ?The history is provided by the patient. No language interpreter was used.  ? ?  ? ?Home Medications ?Prior to Admission medications   ?Medication Sig Start Date End Date Taking? Authorizing Provider  ?aspirin EC 325 MG tablet Take 1 tablet (325 mg total) by mouth 2 (two) times daily after a meal. Take x 1 month post op to decrease risk of blood clots. 06/18/20   Marshia LyBethune, James, PA-C  ?docusate sodium (COLACE) 100 MG capsule Take 1 capsule (100 mg total) by mouth 2 (two) times daily. 06/18/20   Marshia LyBethune, James, PA-C  ?methocarbamol (ROBAXIN) 500 MG tablet Take 1 tablet (500 mg total) by mouth 2 (two) times daily. 12/18/20   Couture, Cortni S, PA-C  ?oxyCODONE-acetaminophen (PERCOCET/ROXICET) 5-325 MG tablet Take 1-2 tablets by mouth every 6 (six) hours as needed for severe pain. 07/05/20   Alvira MondaySchlossman, Erin, MD  ?tiZANidine (ZANAFLEX) 2 MG tablet Take 1 tablet (2 mg total) by mouth every 8 (eight) hours as needed for muscle spasms. 06/18/20   Marshia LyBethune, James, PA-C  ?   ? ?Allergies    ?Penicillins   ? ?Review of Systems   ?Review of  Systems  ?Constitutional:  Negative for chills and fever.  ?HENT:  Negative for congestion.   ?Respiratory:  Negative for cough and shortness of breath.   ?Cardiovascular:  Positive for chest pain. Negative for palpitations and leg swelling.  ?Gastrointestinal:  Negative for abdominal pain, nausea and vomiting.  ?Neurological:  Negative for weakness.  ?All other systems reviewed and are negative. ? ?Physical Exam ?Updated Vital Signs ?BP 118/74 (BP Location: Right Arm)   Pulse 70   Temp 98.9 ?F (37.2 ?C) (Oral)   Resp 15   Ht 5\' 4"  (1.626 m)   Wt 81.6 kg   SpO2 97%   BMI 30.90 kg/m?  ?Physical Exam ? ?ED Results / Procedures / Treatments   ?Labs ?(all labs ordered are listed, but only abnormal results are displayed) ?Labs Reviewed  ?CBC - Abnormal; Notable for the following components:  ?    Result Value  ? RBC 5.14 (*)   ? Hemoglobin 15.9 (*)   ? HCT 48.6 (*)   ? All other components within normal limits  ?BASIC METABOLIC PANEL  ?I-STAT BETA HCG BLOOD, ED ([facility], [facility], [facility] ONLY)  ?TROPONIN I (HIGH SENSITIVITY)  ? ? ?EKG ?None ? ?Radiology ?No results found. ? ?Procedures ?Procedures  ? ? ?Medications Ordered in ED ?Medications  ?ketorolac (TORADOL) 15 MG/ML injection 15 mg (has no administration in time range)  ? ? ?ED Course/ Medical Decision  Making/ A&P ?  ?                        ?Medical Decision Making ?Amount and/or Complexity of Data Reviewed ?Labs: ordered. ?Radiology: ordered. ? ?Risk ?Prescription drug management. ? ? ?Medical Decision Making / ED Course ? ? ?This patient presents to the ED for concern of chest pain, this involves an extensive number of treatment options, and is a complaint that carries with it a high risk of complications and morbidity.  The differential diagnosis includes pneumonia, PE, ACS, MSK pain.  Without URI symptoms, and without evidence of pneumonia on chest x-ray.  Unlikely to be pneumonia.  Negative troponin, without ischemic changes on EKG, unlikely to be ACS.  Pain improved  with Toradol.  Without tachycardia, tachypnea, or dyspnea.  Unlikely to be PE.  Patient has similar episodes of chest pain in the past. ? ?MDM: ?31 year old female presents today for evaluation of chest pain.  This occurred after donating plasma today.  She states she has similar episodes of chest pain often as well as she donates plasma often.  She states she never had chest pain in the past following donating plasma.  She is without associated symptoms.  Denies prior cardiac history.  No significant family cardiac history. ?CBC without leukocytosis or anemia.  BMP with glucose of 100 otherwise unremarkable.  Troponin less than 2.  EKG without acute ischemic changes.  Chest x-ray without acute cardiopulm process.  Patient given Toradol in the emergency room with significant improvement in her symptoms.  Discussed importance of follow-up with her PCP for further work-up and management of her chest pain.  Patient voices understanding and is in agreement with plan.  Return precautions discussed. ? ? ?Lab Tests: ?-I ordered, reviewed, and interpreted labs.   ?The pertinent results include:   ?Labs Reviewed  ?CBC - Abnormal; Notable for the following components:  ?    Result Value  ? RBC 5.14 (*)   ? Hemoglobin 15.9 (*)   ? HCT 48.6 (*)   ? All other components within normal limits  ?BASIC METABOLIC PANEL  ?I-STAT BETA HCG BLOOD, ED ([facility], [facility], [facility] ONLY)  ?TROPONIN I (HIGH SENSITIVITY)  ?  ? ? ?EKG ? EKG Interpretation ? ?Date/Time:    ?Ventricular Rate:    ?PR Interval:    ?QRS Duration:   ?QT Interval:    ?QTC Calculation:   ?R Axis:     ?Text Interpretation:   ?  ? ?  ? ? ? ?Imaging Studies ordered: ?I ordered imaging studies including CXR ?I independently visualized and interpreted imaging. ?I agree with the radiologist interpretation ? ? ?Medicines ordered and prescription drug management: ?Meds ordered this encounter  ?Medications  ? DISCONTD: ketorolac (TORADOL) 15 MG/ML injection 15 mg  ? ketorolac (TORADOL) 15  MG/ML injection 15 mg  ?  ?-I have reviewed the patients home medicines and have made adjustments as needed ? ?Cardiac Monitoring: ?The patient was maintained on a cardiac monitor.  I personally viewed and interpreted the cardiac monitored which showed an underlying rhythm of: Normal sinus rhythm ? ?Reevaluation: ?After the interventions noted above, I reevaluated the patient and found that they have :resolved ? ?Co morbidities that complicate the patient evaluation ? ?Past Medical History:  ?Diagnosis Date  ? Anemia   ? Anxiety   ? Arthritis   ? Asthma   ? Depression   ? No pertinent past medical history   ? Pelvic fracture (  HCC)   ?  ? ? ?Dispostion: ?Patient appropriate for discharge.  Discharged in stable condition. ? ?Final Clinical Impression(s) / ED Diagnoses ?Final diagnoses:  ?Atypical chest pain  ? ? ?Rx / DC Orders ?ED Discharge Orders   ? ? None  ? ?  ? ? ?  ?Marita Kansas, PA-C ?04/28/21 1446 ? ?  ?Jacalyn Lefevre, MD ?04/28/21 1522 ? ?

## 2021-04-28 NOTE — ED Triage Notes (Addendum)
BIB EMS with reproducible cp in top of chest after giving plasma today. Increased pain when talking and taking a deep breath. 12 Ld unremarkable, 116/72-70-100% RA ? ?Pt verifies above, sates she only had a cup of coffee before giving plasma. Describes as stabbing pain alternating with "a ton of bricks" ?

## 2021-04-28 NOTE — Discharge Instructions (Addendum)
Your work-up today was reassuring and without concern for serious cause of your chest pain.  I recommend you follow-up with your primary care doctor for further work-up and management of your chest pain.  If your chest pain worsens, you develop shortness of breath, lightheadedness or other new or worsening symptoms please return to the emergency room. ?

## 2021-04-28 NOTE — ED Notes (Signed)
Ptrain is still in upper chest after med, states she is breathing better. ?

## 2021-08-09 ENCOUNTER — Ambulatory Visit (HOSPITAL_COMMUNITY)
Admission: EM | Admit: 2021-08-09 | Discharge: 2021-08-10 | Disposition: A | Discharge status: Other Acute Inpatient Hospital | Payer: Medicaid Other | Attending: Psychiatry | Admitting: Psychiatry

## 2021-08-09 DIAGNOSIS — R45850 Homicidal ideations: Secondary | ICD-10-CM | POA: Diagnosis not present

## 2021-08-09 DIAGNOSIS — F4310 Post-traumatic stress disorder, unspecified: Secondary | ICD-10-CM | POA: Insufficient documentation

## 2021-08-09 DIAGNOSIS — Z5900 Homelessness unspecified: Secondary | ICD-10-CM | POA: Diagnosis not present

## 2021-08-09 DIAGNOSIS — F319 Bipolar disorder, unspecified: Secondary | ICD-10-CM | POA: Insufficient documentation

## 2021-08-09 DIAGNOSIS — R45851 Suicidal ideations: Secondary | ICD-10-CM

## 2021-08-09 MED ORDER — ACETAMINOPHEN 325 MG PO TABS: 650.0000 mg | ORAL_TABLET | Freq: Four times a day (QID) | ORAL | Status: DC | PRN

## 2021-08-09 MED ORDER — ALUM & MAG HYDROXIDE-SIMETH 200-200-20 MG/5ML PO SUSP: 30.0000 mL | ORAL | Status: DC | PRN

## 2021-08-09 MED ORDER — MAGNESIUM HYDROXIDE 400 MG/5ML PO SUSP: 30.0000 mL | Freq: Every day | ORAL | Status: DC | PRN

## 2021-08-09 NOTE — ED Provider Notes (Signed)
[facility] Continuous Assessment Admission H&P  Date: 08/10/21 Patient Name: Whitney Simpson MRN: [id number] Chief Complaint: No chief complaint on file.     Diagnoses:  Final diagnoses:  Suicidal ideation  Homicidal ideations  Homelessness    HPI: Whitney MeyerYadyra Simpson,  31 y.o female, with a history of anxiety, depression and bipolar disorder and PTSD.  [facility] ideation and thoughts.  According to the patient she does have dreams about stabbing her partner, running them over with a car.  According to patient she has been going through a lot, she does not have custody of her kids.  She is homeless, patient is a registered sex Scientist, research (medical)trafficker and sex offender and she is currently on probation for sex trafficking.  Patient stated she was diagnosed with anxiety, bipolar disorder, PTSD she does see a psychiatrist and a therapist.  Face to face evaluation of patient, she is alert and oriented x4, speech is clear, mood is depressed affect flat.  Patient endorses suicidal ideation, HI, with plans to stab her partner or run her over with a car, and blow up her house.  Patient reports she smoked marijuana a lot, reported that she drinks once a month, denies having access to a gun.  Recommend pt will be [facility] to [facility] for [facility],  spoke with Jen Mowickinson MD     PHQ 2-9:   Flowsheet Row ED from 08/10/2021 [facility]  C-SSRS RISK CATEGORY High Risk No Risk No Risk        Total Time spent with patient: 30 minutes  Musculoskeletal  Strength & Muscle Tone: within normal limits Gait & Station: normal Patient leans: N/A  Psychiatric Specialty Exam  Presentation General Appearance: Bizarre  Eye Contact:Fair  Speech:Clear and Coherent  Speech Volume:Decreased  Handedness:Ambidextrous   Mood and  Affect  Mood:Depressed; Anxious  Affect:Constricted   Thought Process  Thought Processes:Linear  Descriptions of Associations:Circumstantial  Orientation:Full (Time, Place and Person)  Thought Content:WDL    Hallucinations:Hallucinations: None  Ideas of Reference:None  Suicidal Thoughts:Suicidal Thoughts: Yes, Passive  Homicidal Thoughts:Homicidal Thoughts: Yes, Active HI Active Intent and/or Plan: With Plan   Sensorium  Memory:Immediate Fair  Judgment:Poor  Insight:Fair   Executive Functions  Concentration:Poor  Attention Span:Fair  Recall:Fair  Fund of Knowledge:Fair  Language:Fair   Psychomotor Activity  Psychomotor Activity:Psychomotor Activity: Normal   Assets  Assets:Desire for Improvement; Housing   Sleep  Sleep:Sleep: Fair   Nutritional Assessment (For OBS and [facility]BC admissions only) Has the patient had a weight loss or gain of 10 pounds or more in the last 3 months?: No Has the patient had a decrease in food intake/or appetite?: No Does the patient have dental problems?: No Does the patient have eating habits or behaviors that may be indicators of an eating disorder including binging or inducing vomiting?: No Has the patient recently lost weight without trying?: 0    Physical Exam HENT:     Head: Normocephalic.     Nose: Nose normal.  Cardiovascular:     Rate and Rhythm: Normal rate.  Pulmonary:     Effort: Pulmonary effort is normal.  Musculoskeletal:        General: Normal range of motion.     Cervical back: Normal range of motion.  Skin:    General: Skin is warm.  Neurological:     General:  No focal deficit present.     Mental Status: She is alert.  Psychiatric:        Mood and Affect: Mood normal.        Behavior: Behavior normal.        Thought Content: Thought content normal.        Judgment: Judgment normal.    Review of Systems  Constitutional: Negative.   HENT: Negative.    Eyes: Negative.   Respiratory:  Negative.    Cardiovascular: Negative.   Gastrointestinal: Negative.   Genitourinary: Negative.   Musculoskeletal: Negative.   Skin: Negative.   Neurological: Negative.   Endo/Heme/Allergies: Negative.   Psychiatric/Behavioral:  Positive for depression. The patient is nervous/anxious.     Blood pressure 118/61, pulse 72, temperature 98.1 F (36.7 C), resp. rate 17, SpO2 98 %, unknown if currently breastfeeding. There is no height or weight on file to calculate BMI.  Past Psychiatric History: Ptsd,  bipolar, anxiety   Is the patient at risk to self? Yes  Has the patient been a risk to self in the past 6 months? Yes .    Has the patient been a risk to self within the distant past? Yes   Is the patient a risk to others? Yes   Has the patient been a risk to others in the past 6 months? Yes   Has the patient been a risk to others within the distant past? Yes   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   Procedure: TOTAL HIP ARTHROPLASTY ANTERIOR APPROACH;  Surgeon: Jodi Geralds, MD;  Location: [facility];  Service: Orthopedics;  Laterality: Right;    Family History:  Family History  Problem Relation Age of Onset   Hypertension Mother    Hypertension Father    Diabetes Other    Hypertension Other    Anesthesia problems Neg Hx    Hypotension Neg Hx    Malignant hyperthermia Neg Hx    Pseudochol deficiency Neg Hx     Social History:  Social History   Socioeconomic History   Marital status: Single    Spouse name: Not on file   Number of children: Not on file   Years of education: Not on file   Highest education level: Not on file  Occupational History   Not on file  Tobacco  Use   Smoking status: Never   Smokeless tobacco: Never  Vaping Use   Vaping Use: Never used  Substance and Sexual Activity   Alcohol use: Yes    Comment: ocass.   Drug use: Not Currently    Types: Marijuana   Sexual activity: Yes    Birth control/protection: None  Other Topics Concern   Not on file  Social History Narrative   Not on file   Social Determinants of Health   Financial Resource Strain: Not on file  Food Insecurity: Not on file  Transportation Needs: Not on file  Physical Activity: Not on file  Stress: Not on file  Social Connections: Not on file  Intimate Partner Violence: Not on file  SDOH:  SDOH Screenings   Alcohol Screen: Not on file  Depression (PHQ2-9): Not on file  Financial Resource Strain: Not on file  Food Insecurity: Not on file  Housing: Not on file  Physical Activity: Not on file  Social Connections: Not on file  Stress: Not on file  Tobacco Use: Low Risk  (08/10/2021)   Patient History    Smoking Tobacco Use: Never    Smokeless Tobacco Use: Never    Passive Exposure: Not on file  Transportation Needs: Not on file    Last Labs:  Admission on 08/10/2021  Component Date Value Ref Range Status   Sodium 08/10/2021 140  135 - 145 mmol/L Final   Potassium 08/10/2021 4.1  3.5 - 5.1 mmol/L Final   Chloride 08/10/2021 110  98 - 111 mmol/L Final   CO2 08/10/2021 23  22 - 32 mmol/L Final   Glucose, Bld 08/10/2021 96  70 - 99 mg/dL Final   Glucose reference range applies only to samples taken after fasting for at least 8 hours.   BUN 08/10/2021 12  6 - 20 mg/dL Final   Creatinine, Ser 08/10/2021 0.91  0.44 - 1.00 mg/dL Final   Calcium 57/97/2820 8.9  8.9 - 10.3 mg/dL Final   Total Protein 60/15/6153 5.5 (L)  6.5 - 8.1 g/dL Final   Albumin 79/43/2761 3.1 (L)  3.5 - 5.0 g/dL Final   AST 47/10/2955 16  15 - 41 U/L Final   ALT 08/10/2021 13  0 - 44 U/L Final   Alkaline Phosphatase 08/10/2021 76  38 - 126 U/L Final   Total Bilirubin 08/10/2021  0.2 (L)  0.3 - 1.2 mg/dL Final   GFR, Estimated 08/10/2021 >60  >60 mL/min Final   Comment: (NOTE) Calculated using the CKD-EPI Creatinine Equation (2021)    Anion gap 08/10/2021 7  5 - 15 Final   Performed at [facility], [address], [place], [place] [address]   Alcohol, Ethyl (B) 08/10/2021 <10  <10 mg/dL Final   Comment: (NOTE) Lowest detectable limit for serum alcohol is 10 mg/dL.  For medical purposes only. Performed at [facility], [address], [place], [place] [address]    Salicylate Lvl 08/10/2021 <7.0 (L)  7.0 - 30.0 mg/dL Final   Performed at [facility], [address], [place], [place] [address]   Acetaminophen (Tylenol), Serum 08/10/2021 <10 (L)  10 - 30 ug/mL Final   Comment: (NOTE) Therapeutic concentrations vary significantly. A range of 10-30 ug/mL  may be an effective concentration for many patients. However, some  are best treated at concentrations outside of this range. Acetaminophen concentrations >150 ug/mL at 4 hours after ingestion  and >50 ug/mL at 12 hours after ingestion are often associated with  toxic reactions.  Performed at [facility], [address] [place], [place] [address]    WBC 08/10/2021 6.4  4.0 - 10.5 K/uL Final   RBC 08/10/2021 4.02  3.87 - 5.11 MIL/uL Final   Hemoglobin 08/10/2021 12.7  12.0 - 15.0 g/dL Final   HCT 75/43/6067 37.9  36.0 - 46.0 % Final   MCV 08/10/2021 94.3  80.0 - 100.0 fL Final   MCH 08/10/2021 31.6  26.0 - 34.0 pg Final   MCHC 08/10/2021 33.5  30.0 - 36.0 g/dL Final   RDW 70/34/0352 12.9  11.5 - 15.5 % Final   Platelets 08/10/2021 167  150 - 400 K/uL Final   nRBC 08/10/2021 0.0  0.0 -  0.2 % Final   Performed at [facility], [address] [place], [place] [address]   SARS Coronavirus 2 by RT PCR 08/10/2021 NEGATIVE  NEGATIVE Final   Comment: (NOTE) SARS-CoV-2 target nucleic acids are NOT DETECTED.  The SARS-CoV-2 RNA is generally detectable in upper  respiratory specimens during the acute phase of infection. The lowest concentration of SARS-CoV-2 viral copies this assay can detect is 138 copies/mL. A negative result does not preclude SARS-Cov-2 infection and should not be used as the sole basis for treatment or other patient management decisions. A negative result may occur with  improper specimen collection/handling, submission of specimen other than nasopharyngeal swab, presence of viral mutation(s) within the areas targeted by this assay, and inadequate number of viral copies(<138 copies/mL). A negative result must be combined with clinical observations, patient history, and epidemiological information. The expected result is Negative.  Fact Sheet for Patients:  BloggerCourse.com  Fact Sheet for Healthcare Providers:  SeriousBroker.it  This test is no                          t yet approved or cleared by the [place] FDA and  has been authorized for detection and/or diagnosis of SARS-CoV-2 by FDA under an Emergency Use Authorization (EUA). This EUA will remain  in effect (meaning this test can be used) for the duration of the COVID-19 declaration under Section 564(b)(1) of the Act, 21 U.S.C.section 360bbb-3(b)(1), unless the authorization is terminated  or revoked sooner.       Influenza A by PCR 08/10/2021 NEGATIVE  NEGATIVE Final   Influenza B by PCR 08/10/2021 NEGATIVE  NEGATIVE Final   Comment: (NOTE) The Xpert Xpress SARS-CoV-2/FLU/RSV plus assay is intended as an aid in the diagnosis of influenza from Nasopharyngeal swab specimens and should not be used as a sole basis for treatment. Nasal washings and aspirates are unacceptable for Xpert Xpress SARS-CoV-2/FLU/RSV testing.  Fact Sheet for Patients: BloggerCourse.com  Fact Sheet for Healthcare Providers: SeriousBroker.it  This test is not yet approved or  cleared by the [place] FDA and has been authorized for detection and/or diagnosis of SARS-CoV-2 by FDA under an Emergency Use Authorization (EUA). This EUA will remain in effect (meaning this test can be used) for the duration of the COVID-19 declaration under Section 564(b)(1) of the Act, 21 U.S.C. section 360bbb-3(b)(1), unless the authorization is terminated or revoked.  Performed at [facility], [address], [place], [place] [address]   Admission on 04/28/2021, Discharged on 04/28/2021  Component Date Value Ref Range Status   Sodium 04/28/2021 138  135 - 145 mmol/L Final   Potassium 04/28/2021 3.8  3.5 - 5.1 mmol/L Final   Chloride 04/28/2021 107  98 - 111 mmol/L Final   CO2 04/28/2021 26  22 - 32 mmol/L Final   Glucose, Bld 04/28/2021 100 (H)  70 - 99 mg/dL Final   Glucose reference range applies only to samples taken after fasting for at least 8 hours.   BUN 04/28/2021 9  6 - 20 mg/dL Final   Creatinine, Ser 04/28/2021 0.65  0.44 - 1.00 mg/dL Final   Calcium 72/10/4707 8.8 (L)  8.9 - 10.3 mg/dL Final   GFR, Estimated 04/28/2021 >60  >60 mL/min Final   Comment: (NOTE) Calculated using the CKD-EPI Creatinine Equation (2021)    Anion gap 04/28/2021 5  5 - 15 Final   Performed at [facility], [address]  [place] [place], [place] [address]   WBC 04/28/2021 8.0  4.0 - 10.5 K/uL Final   RBC 04/28/2021 5.14 (H)  3.87 - 5.11 MIL/uL Final   Hemoglobin 04/28/2021 15.9 (H)  12.0 - 15.0 g/dL Final   HCT 98/12/9145 48.6 (H)  36.0 - 46.0 % Final   MCV 04/28/2021 94.6  80.0 - 100.0 fL Final   MCH 04/28/2021 30.9  26.0 - 34.0 pg Final   MCHC 04/28/2021 32.7  30.0 - 36.0 g/dL Final   RDW 82/95/6213 13.1  11.5 - 15.5 % Final   Platelets 04/28/2021 204  150 - 400 K/uL Final   nRBC 04/28/2021 0.0  0.0 - 0.2 % Final   Performed at [facility], [address] [place], [place] [address]   Troponin I (High Sensitivity) 04/28/2021 <2  <18 ng/L  Final   Comment: (NOTE) Elevated high sensitivity troponin I (hsTnI) values and significant  changes across serial measurements may suggest ACS but many other  chronic and acute conditions are known to elevate hsTnI results.  Refer to the "Links" section for chest pain algorithms and additional  guidance. Performed at [facility], [address] [place], [place] [address]    I-stat hCG, quantitative 04/28/2021 <5.0  <5 mIU/mL Final   Comment 3 04/28/2021          Final   Comment:   GEST. AGE      CONC.  (mIU/mL)   <=1 WEEK        5 - 50     2 WEEKS       50 - 500     3 WEEKS       100 - 10,000     4 WEEKS     1,000 - 30,000        FEMALE AND NON-PREGNANT FEMALE:     LESS THAN 5 mIU/mL   Admission on 03/29/2021, Discharged on 03/29/2021  Component Date Value Ref Range Status   Sodium 03/29/2021 142  135 - 145 mmol/L Final   Potassium 03/29/2021 4.0  3.5 - 5.1 mmol/L Final   Chloride 03/29/2021 110  98 - 111 mmol/L Final   CO2 03/29/2021 25  22 - 32 mmol/L Final   Glucose, Bld 03/29/2021 88  70 - 99 mg/dL Final   Glucose reference range applies only to samples taken after fasting for at least 8 hours.   BUN 03/29/2021 6  6 - 20 mg/dL Final   Creatinine, Ser 03/29/2021 0.69  0.44 - 1.00 mg/dL Final   Calcium 29/52/8413 9.0  8.9 - 10.3 mg/dL Final   Total Protein 24/40/1027 5.6 (L)  6.5 - 8.1 g/dL Final   Albumin 25/36/6440 3.2 (L)  3.5 - 5.0 g/dL Final   AST 34/74/2595 15  15 - 41 U/L Final   ALT 03/29/2021 15  0 - 44 U/L Final   Alkaline Phosphatase 03/29/2021 74  38 - 126 U/L Final   Total Bilirubin 03/29/2021 0.5  0.3 - 1.2 mg/dL Final   GFR, Estimated 03/29/2021 >60  >60 mL/min Final   Comment: (NOTE) Calculated using the CKD-EPI Creatinine Equation (2021)    Anion gap 03/29/2021 7  5 - 15 Final   Performed at [facility], [address], [place], [place] [address]   WBC 03/29/2021 6.9  4.0 - 10.5 K/uL Final   RBC 03/29/2021 4.08  3.87 - 5.11  MIL/uL Final   Hemoglobin 03/29/2021 12.6  12.0 - 15.0 g/dL Final  HCT 03/29/2021 39.4  36.0 - 46.0 % Final   MCV 03/29/2021 96.6  80.0 - 100.0 fL Final   MCH 03/29/2021 30.9  26.0 - 34.0 pg Final   MCHC 03/29/2021 32.0  30.0 - 36.0 g/dL Final   RDW 59/93/5701 12.8  11.5 - 15.5 % Final   Platelets 03/29/2021 202  150 - 400 K/uL Final   nRBC 03/29/2021 0.0  0.0 - 0.2 % Final   Neutrophils Relative % 03/29/2021 68  % Final   Neutro Abs 03/29/2021 4.7  1.7 - 7.7 K/uL Final   Lymphocytes Relative 03/29/2021 22  % Final   Lymphs Abs 03/29/2021 1.5  0.7 - 4.0 K/uL Final   Monocytes Relative 03/29/2021 7  % Final   Monocytes Absolute 03/29/2021 0.5  0.1 - 1.0 K/uL Final   Eosinophils Relative 03/29/2021 2  % Final   Eosinophils Absolute 03/29/2021 0.1  0.0 - 0.5 K/uL Final   Basophils Relative 03/29/2021 1  % Final   Basophils Absolute 03/29/2021 0.0  0.0 - 0.1 K/uL Final   Immature Granulocytes 03/29/2021 0  % Final   Abs Immature Granulocytes 03/29/2021 0.03  0.00 - 0.07 K/uL Final   Performed at [facility], [address], [place], [place] [address]   Lipase 03/29/2021 43  11 - 51 U/L Final   Performed at [facility], [address], [place], [place] [address]   I-stat hCG, quantitative 03/29/2021 <5.0  <5 mIU/mL Final   Comment 3 03/29/2021          Final   Comment:   GEST. AGE      CONC.  (mIU/mL)   <=1 WEEK        5 - 50     2 WEEKS       50 - 500     3 WEEKS       100 - 10,000     4 WEEKS     1,000 - 30,000        FEMALE AND NON-PREGNANT FEMALE:     LESS THAN 5 mIU/mL     Allergies: Penicillins  PTA Medications: (Not in a hospital admission)   Medical Decision Making  Inpatient observation     Recommendations  Based on my evaluation the patient does not appear to have an emergency medical condition.  Sindy Guadeloupe, NP 08/10/21  5:30 AM
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
## 2021-08-10 ENCOUNTER — Emergency Department (HOSPITAL_COMMUNITY)
Admission: EM | Admit: 2021-08-10 | Discharge: 2021-08-11 | Disposition: A | Discharge status: Other Acute Inpatient Hospital | Payer: Medicaid Other | Attending: Emergency Medicine | Admitting: Emergency Medicine

## 2021-08-10 ENCOUNTER — Encounter (HOSPITAL_COMMUNITY): Payer: Self-pay

## 2021-08-10 ENCOUNTER — Other Ambulatory Visit: Payer: Self-pay

## 2021-08-10 DIAGNOSIS — J45909 Unspecified asthma, uncomplicated: Secondary | ICD-10-CM | POA: Insufficient documentation

## 2021-08-10 DIAGNOSIS — R45851 Suicidal ideations: Secondary | ICD-10-CM | POA: Insufficient documentation

## 2021-08-10 DIAGNOSIS — R45850 Homicidal ideations: Secondary | ICD-10-CM | POA: Diagnosis not present

## 2021-08-10 DIAGNOSIS — Z7982 Long term (current) use of aspirin: Secondary | ICD-10-CM | POA: Insufficient documentation

## 2021-08-10 DIAGNOSIS — Z20822 Contact with and (suspected) exposure to covid-19: Secondary | ICD-10-CM | POA: Insufficient documentation

## 2021-08-10 DIAGNOSIS — F332 Major depressive disorder, recurrent severe without psychotic features: Secondary | ICD-10-CM | POA: Diagnosis not present

## 2021-08-10 DIAGNOSIS — F339 Major depressive disorder, recurrent, unspecified: Secondary | ICD-10-CM | POA: Insufficient documentation

## 2021-08-10 DIAGNOSIS — F1290 Cannabis use, unspecified, uncomplicated: Secondary | ICD-10-CM | POA: Diagnosis present

## 2021-08-10 LAB — CBC
HCT: 37.9 % (ref 36.0–46.0)
Hemoglobin: 12.7 g/dL (ref 12.0–15.0)
MCH: 31.6 pg (ref 26.0–34.0)
MCHC: 33.5 g/dL (ref 30.0–36.0)
MCV: 94.3 fL (ref 80.0–100.0)
Platelets: 167 10*3/uL (ref 150–400)
RBC: 4.02 MIL/uL (ref 3.87–5.11)
RDW: 12.9 % (ref 11.5–15.5)
WBC: 6.4 10*3/uL (ref 4.0–10.5)
nRBC: 0 % (ref 0.0–0.2)

## 2021-08-10 LAB — COMPREHENSIVE METABOLIC PANEL
ALT: 13 U/L (ref 0–44)
AST: 16 U/L (ref 15–41)
Albumin: 3.1 g/dL — ABNORMAL LOW (ref 3.5–5.0)
Alkaline Phosphatase: 76 U/L (ref 38–126)
Anion gap: 7 (ref 5–15)
BUN: 12 mg/dL (ref 6–20)
CO2: 23 mmol/L (ref 22–32)
Calcium: 8.9 mg/dL (ref 8.9–10.3)
Chloride: 110 mmol/L (ref 98–111)
Creatinine, Ser: 0.91 mg/dL (ref 0.44–1.00)
GFR, Estimated: >60 mL/min (ref >60)
Glucose, Bld: 96 mg/dL (ref 70–99)
Potassium: 4.1 mmol/L (ref 3.5–5.1)
Sodium: 140 mmol/L (ref 135–145)
Total Bilirubin: 0.2 mg/dL — ABNORMAL LOW (ref 0.3–1.2)
Total Protein: 5.5 g/dL — ABNORMAL LOW (ref 6.5–8.1)

## 2021-08-10 LAB — RESP PANEL BY RT-PCR (FLU A&B, COVID) ARPGX2
Influenza A by PCR: NEGATIVE
Influenza B by PCR: NEGATIVE
SARS Coronavirus 2 by RT PCR: NEGATIVE

## 2021-08-10 LAB — ETHANOL: Alcohol, Ethyl (B): <10 mg/dL (ref <10)

## 2021-08-10 LAB — ACETAMINOPHEN LEVEL: Acetaminophen (Tylenol), Serum: <10 ug/mL — ABNORMAL LOW (ref 10–30)

## 2021-08-10 LAB — SALICYLATE LEVEL: Salicylate Lvl: <7 mg/dL — ABNORMAL LOW (ref 7.0–30.0)

## 2021-08-10 MED ORDER — OXCARBAZEPINE 150 MG PO TABS
150.0000 mg | ORAL_TABLET | Freq: Two times a day (BID) | ORAL | Status: DC
  Administered 2021-08-10: 150 mg via ORAL
  Filled 2021-08-10 (×2): qty 1

## 2021-08-10 MED ORDER — HYDROXYZINE HCL 10 MG PO TABS
25.0000 mg | ORAL_TABLET | Freq: Two times a day (BID) | ORAL | Status: DC
  Administered 2021-08-10: 25 mg via ORAL
  Filled 2021-08-10 (×2): qty 3

## 2021-08-10 MED ORDER — ACETAMINOPHEN 325 MG PO TABS
650.0000 mg | ORAL_TABLET | ORAL | Status: DC | PRN
  Administered 2021-08-11: 650 mg via ORAL
  Filled 2021-08-10: qty 2

## 2021-08-10 MED ORDER — ONDANSETRON HCL 4 MG PO TABS: 4.0000 mg | ORAL_TABLET | Freq: Three times a day (TID) | ORAL | Status: DC | PRN

## 2021-08-10 MED ORDER — ZOLPIDEM TARTRATE 5 MG PO TABS
5.0000 mg | ORAL_TABLET | Freq: Every evening | ORAL | Status: DC | PRN
  Administered 2021-08-11: 5 mg via ORAL
  Filled 2021-08-10: qty 1

## 2021-08-10 NOTE — ED Notes (Signed)
Voluntary paper for PT is in [place] zone

## 2021-08-10 NOTE — Progress Notes (Signed)
CSW requested that [facility] Fransico Michael, RN review for [facility]. CSW will assist and follow with placement.   Maryjean Ka, MSW, [facility] 08/10/2021 9:27 PM
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
## 2021-08-10 NOTE — ED Notes (Signed)
IVC clipboard for pt is in orange.
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
## 2021-08-10 NOTE — ED Notes (Signed)
Patient reminded of the need for a urine sample at this time 

## 2021-08-10 NOTE — ED Notes (Signed)
Patient provided supplies for a shower and updated on the plan of care. Patient appears very anxious and irritable prior to conversation with this RN. This RN spoke with the patient and let her know that she can take a shower but her sitter will have to sit outside of the door and the door cannot be locked. Patient agrees and happily does such. Patient requesting her medications that have been overdue for some time. This RN to reach out to a provider to see what can be obtained.

## 2021-08-10 NOTE — ED Notes (Signed)
Pt changed into purple scrubs. Pt's clothing and personal items collected and Pt was wanded by [employer]

## 2021-08-10 NOTE — ED Notes (Addendum)
Patient moved into a private room for TTS assessment at this time. Patient provided a chair and warm blankets upon her arrival to said room. Patient verbalizes understanding of the plan of care. Sitter also at bedside at this time

## 2021-08-10 NOTE — ED Notes (Signed)
Pt resting comfortably at this time. Respirations even and unlabored.  

## 2021-08-10 NOTE — ED Notes (Signed)
Belongings in locker 11

## 2021-08-11 ENCOUNTER — Inpatient Hospital Stay (HOSPITAL_COMMUNITY)
Admission: AD | Admit: 2021-08-11 | Discharge: 2021-08-17 | DRG: 885 | Disposition: A | Discharge status: Home / Self Care | Payer: Medicaid Other | Attending: Psychiatry | Admitting: Psychiatry

## 2021-08-11 ENCOUNTER — Encounter (HOSPITAL_COMMUNITY): Payer: Self-pay | Admitting: Psychiatry

## 2021-08-11 ENCOUNTER — Other Ambulatory Visit: Payer: Self-pay

## 2021-08-11 DIAGNOSIS — G43909 Migraine, unspecified, not intractable, without status migrainosus: Secondary | ICD-10-CM | POA: Diagnosis present

## 2021-08-11 DIAGNOSIS — Z933 Colostomy status: Secondary | ICD-10-CM | POA: Diagnosis not present

## 2021-08-11 DIAGNOSIS — F4310 Post-traumatic stress disorder, unspecified: Secondary | ICD-10-CM

## 2021-08-11 DIAGNOSIS — F411 Generalized anxiety disorder: Secondary | ICD-10-CM | POA: Diagnosis present

## 2021-08-11 DIAGNOSIS — Z818 Family history of other mental and behavioral disorders: Secondary | ICD-10-CM

## 2021-08-11 DIAGNOSIS — F1290 Cannabis use, unspecified, uncomplicated: Secondary | ICD-10-CM | POA: Diagnosis present

## 2021-08-11 DIAGNOSIS — F1210 Cannabis abuse, uncomplicated: Secondary | ICD-10-CM | POA: Diagnosis present

## 2021-08-11 DIAGNOSIS — R45851 Suicidal ideations: Secondary | ICD-10-CM | POA: Diagnosis present

## 2021-08-11 DIAGNOSIS — Z79899 Other long term (current) drug therapy: Secondary | ICD-10-CM | POA: Diagnosis not present

## 2021-08-11 DIAGNOSIS — F332 Major depressive disorder, recurrent severe without psychotic features: Principal | ICD-10-CM | POA: Diagnosis present

## 2021-08-11 DIAGNOSIS — Z96641 Presence of right artificial hip joint: Secondary | ICD-10-CM | POA: Diagnosis present

## 2021-08-11 DIAGNOSIS — R45850 Homicidal ideations: Secondary | ICD-10-CM | POA: Diagnosis present

## 2021-08-11 DIAGNOSIS — F329 Major depressive disorder, single episode, unspecified: Secondary | ICD-10-CM | POA: Diagnosis present

## 2021-08-11 LAB — I-STAT BETA HCG BLOOD, ED (MC, WL, AP ONLY): I-stat hCG, quantitative: <5 m[IU]/mL (ref <5)

## 2021-08-11 LAB — RAPID URINE DRUG SCREEN, HOSP PERFORMED
Amphetamines: NOT DETECTED
Barbiturates: NOT DETECTED
Benzodiazepines: NOT DETECTED
Cocaine: NOT DETECTED
Opiates: NOT DETECTED
Tetrahydrocannabinol: POSITIVE — AB

## 2021-08-11 MED ORDER — ALUM & MAG HYDROXIDE-SIMETH 200-200-20 MG/5ML PO SUSP: 30.0000 mL | ORAL | Status: DC | PRN

## 2021-08-11 MED ORDER — MAGNESIUM HYDROXIDE 400 MG/5ML PO SUSP
30.0000 mL | Freq: Every day | ORAL | Status: DC | PRN
  Administered 2021-08-13 – 2021-08-14 (×3): 30 mL via ORAL
  Filled 2021-08-11 (×3): qty 30

## 2021-08-11 MED ORDER — ACETAMINOPHEN 325 MG PO TABS
650.0000 mg | ORAL_TABLET | Freq: Four times a day (QID) | ORAL | Status: DC | PRN
  Administered 2021-08-12 – 2021-08-13 (×2): 650 mg via ORAL
  Filled 2021-08-11 (×2): qty 2

## 2021-08-11 MED ORDER — OXCARBAZEPINE 150 MG PO TABS
150.0000 mg | ORAL_TABLET | Freq: Two times a day (BID) | ORAL | Status: DC
  Administered 2021-08-11 – 2021-08-13 (×4): 150 mg via ORAL
  Filled 2021-08-11 (×8): qty 1

## 2021-08-11 MED ORDER — ESCITALOPRAM OXALATE 10 MG PO TABS
10.0000 mg | ORAL_TABLET | Freq: Every day | ORAL | Status: DC
  Administered 2021-08-11: 10 mg via ORAL
  Filled 2021-08-11 (×4): qty 1

## 2021-08-11 MED ORDER — KETOROLAC TROMETHAMINE 60 MG/2ML IM SOLN
30.0000 mg | Freq: Once | INTRAMUSCULAR | Status: AC
  Administered 2021-08-11: 30 mg via INTRAMUSCULAR
  Filled 2021-08-11: qty 2

## 2021-08-11 MED ORDER — TRAZODONE HCL 50 MG PO TABS
50.0000 mg | ORAL_TABLET | Freq: Every evening | ORAL | Status: DC | PRN
  Administered 2021-08-11: 50 mg via ORAL
  Filled 2021-08-11 (×2): qty 1

## 2021-08-11 MED ORDER — ESCITALOPRAM OXALATE 10 MG PO TABS: 10.0000 mg | ORAL_TABLET | Freq: Every day | ORAL | Status: DC

## 2021-08-11 MED ORDER — ESCITALOPRAM OXALATE 10 MG PO TABS
10.0000 mg | ORAL_TABLET | Freq: Every day | ORAL | Status: DC
  Administered 2021-08-12 – 2021-08-17 (×6): 10 mg via ORAL
  Filled 2021-08-11: qty 14
  Filled 2021-08-11 (×7): qty 1

## 2021-08-11 MED ORDER — HYDROXYZINE HCL 25 MG PO TABS
25.0000 mg | ORAL_TABLET | Freq: Three times a day (TID) | ORAL | Status: DC | PRN
  Administered 2021-08-11 – 2021-08-16 (×5): 25 mg via ORAL
  Filled 2021-08-11 (×6): qty 1

## 2021-08-11 NOTE — Progress Notes (Signed)
   08/11/21 1116  Psych Admission Type (Psych Patients Only)  Admission Status Voluntary  Psychosocial Assessment  Patient Complaints Irritability  Eye Contact Fair  Facial Expression Anxious  Affect Irritable  Speech Logical/coherent  Interaction Assertive  Motor Activity Fidgety  Appearance/Hygiene In scrubs  Behavior Characteristics Cooperative;Irritable  Mood Irritable  Thought Process  Coherency WDL  Content WDL  Delusions None reported or observed  Perception WDL  Hallucination None reported or observed  Judgment Poor  Confusion None  Danger to Self  Current suicidal ideation? Denies  Agreement Not to Harm Self Yes  Description of Agreement verbal  Danger to Others  Danger to Others None reported or observed

## 2021-08-11 NOTE — Tx Team (Signed)
Initial Treatment Plan 08/11/2021 5:35 AM Ernesto Rutherford OEV:[id number]    PATIENT STRESSORS: Financial difficulties   Marital or family conflict     PATIENT STRENGTHS: Supportive family/friends    PATIENT IDENTIFIED PROBLEMS: HI  Mood instability   Relationship conflict  Homeless                DISCHARGE CRITERIA:  Adequate post-discharge living arrangements Improved stabilization in mood, thinking, and/or behavior Reduction of life-threatening or endangering symptoms to within safe limits  PRELIMINARY DISCHARGE PLAN: Placement in alternative living arrangements  PATIENT/FAMILY INVOLVEMENT: This treatment plan has been presented to and reviewed with the patient, Whitney Simpson. The patient have been given the opportunity to ask questions and make suggestions.  Marja Kays, RN 08/11/2021, 5:35 AM

## 2021-08-11 NOTE — BHH Group Notes (Signed)
Adult Relaxation Group Note  Date:  08/11/2021 Time:  2:33 PM  Pt did not attend Relaxation group.   Thomas Hoff 08/11/2021, 2:33 PM

## 2021-08-11 NOTE — BHH Counselor (Signed)
CSW provided the Pt with a packet that contains information including shelter and housing resources, free and reduced price food information, clothing resources, crisis center information, a Good[employer] card, [employer], and suicide prevention information.      At the Pt's request CSW also provided the Pt with the phone number to [facility].  The Pt states that she is interested in residential substance use treatment but denies all substance use other than Marijuana.  CSW will also make a referral to [facility] in [facility].

## 2021-08-11 NOTE — ED Notes (Signed)
Provider notified of the patient's request for anxiety relief

## 2021-08-11 NOTE — Progress Notes (Signed)
   08/11/21 1950  Psych Admission Type (Psych Patients Only)  Admission Status Voluntary  Psychosocial Assessment  Patient Complaints Irritability;Anxiety;Depression  Eye Contact Brief  Facial Expression Anxious  Affect Irritable  Speech Logical/coherent  Interaction Minimal  Motor Activity Other (Comment) (wnl)  Appearance/Hygiene Unremarkable  Behavior Characteristics Cooperative  Mood Irritable  Thought Process  Coherency Circumstantial  Content Blaming others  Delusions None reported or observed  Perception WDL  Hallucination None reported or observed  Judgment Poor  Confusion None  Danger to Self  Current suicidal ideation? Denies  Danger to Others  Danger to Others None reported or observed   Progress note   D: Pt seen in her room. Pt denies SI, AVH. Pt endorses HI towards her girlfriend but denies a lethal plan and intent. When asked the reason for HI towards girlfriend, pt states, "Because she's stupid." Pt cautioned about consequences of such actions. "It's nice to think about. But, that's why I wouldn't do anything." Pt encouraged to attend groups to learn coping skills and other tools. "That's why I'm sitting here doing this puzzle. I don't want to be around these people. don't like to be around people. I don't their energy." Pt rates pain  0/10. Pt rates anxiety  4/10 and depression  7/10. Pt endorsed need for something to sleep tonight.  A: Pt provided support and encouragement. Pt given scheduled medication as prescribed. PRNs as appropriate. Q15 min checks for safety.   R: Pt safe on the unit. Will continue to monitor.
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
## 2021-08-11 NOTE — Progress Notes (Signed)
Pt was accepted to [facility] PENDING Medical Clearance; UA needed.  Please fax Vol consent to [phone]; prior to accepting information being given.  Pt meets inpatient criteria per Assunta Found, NP  Report can be called to: Adult unit: [phone]  Care Team notified: Sindy Guadeloupe, NP, [facility] Fransico Michael, RN, Lona Millard, RN, Nelly Rout, MD, Assunta Found, NP, Gretta Arab, RN, [facility] Day shift Rona Ravens, RN, and [employer], Charity fundraiser.  Kelton Pillar, LCSWA 08/11/2021 @ 12:51 AM

## 2021-08-11 NOTE — Plan of Care (Signed)
  Problem: Coping: Goal: Ability to demonstrate self-control will improve Outcome: Progressing   Problem: Health Behavior/Discharge Planning: Goal: Compliance with treatment plan for underlying cause of condition will improve Outcome: Progressing   Problem: Safety: Goal: Periods of time without injury will increase Outcome: Progressing   
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
## 2021-08-11 NOTE — Progress Notes (Signed)
[facility] Group Notes:  (Nursing/MHT/Case Management/Adjunct)  Date:  08/11/2021  Time:  2020  Type of Therapy:   wrap up group  Participation Level:  Active  Participation Quality:  Appropriate, Attentive, and Supportive  Affect:  Depressed, Flat, and Irritable  Cognitive:  Alert  Insight:  Improving  Engagement in Group:  Engaged  Modes of Intervention:  Clarification, Education, and Support  Summary of Progress/Problems: Positive thinking and positive change were discussed.   Marcille Buffy 08/11/2021, 9:25 PM

## 2021-08-11 NOTE — ED Notes (Signed)
[facility] called 

## 2021-08-11 NOTE — H&P (Cosign Needed)
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 improve, Ability to maintain clinical measurements within normal limits will improve, , and Ability to identify triggers associated with substance abuse/mental health issues will improve   Physician Treatment Plan for Secondary Diagnosis: Principal Problem:   MDD (major depressive disorder), recurrent severe, without psychosis (HCC) Active Problems:   Cannabis use disorder   Long Term Goal(s): Improvement in symptoms so as ready for discharge.  Short Term Goal (s): Ability to identify changes in lifestyle to reduce recurrence of condition will improve, Ability to verbalize feelings will improve, Ability to disclose and discuss suicidal ideas, Ability  to demonstrate self-control will improve, Ability to identify and develop effective coping behaviors will improve, Ability to maintain clinical measurements within normal limits will improve, and Ability to identify triggers associated with substance abuse/mental health issues will improve   I certify that inpatient services furnished can reasonably be expected to improve the patient's condition.    Karsten Ro, MD PGY 2 6/22/20238:01 PM

## 2021-08-12 ENCOUNTER — Encounter (HOSPITAL_COMMUNITY): Payer: Self-pay

## 2021-08-12 DIAGNOSIS — F4310 Post-traumatic stress disorder, unspecified: Secondary | ICD-10-CM | POA: Diagnosis not present

## 2021-08-12 LAB — LIPID PANEL
Cholesterol: 163 mg/dL (ref 0–200)
HDL: 52 mg/dL (ref >40)
LDL Cholesterol: 87 mg/dL (ref 0–99)
Total CHOL/HDL Ratio: 3.1 RATIO
Triglycerides: 119 mg/dL (ref <150)
VLDL: 24 mg/dL (ref 0–40)

## 2021-08-12 LAB — TSH: TSH: 1.437 u[IU]/mL (ref 0.350–4.500)

## 2021-08-12 LAB — HEMOGLOBIN A1C
Hgb A1c MFr Bld: 5.2 % (ref 4.8–5.6)
Mean Plasma Glucose: 102.54 mg/dL

## 2021-08-12 MED ORDER — NAPROXEN 500 MG PO TABS
500.0000 mg | ORAL_TABLET | Freq: Two times a day (BID) | ORAL | Status: DC | PRN
  Administered 2021-08-12 – 2021-08-14 (×3): 500 mg via ORAL
  Filled 2021-08-12 (×3): qty 1

## 2021-08-12 MED ORDER — TRAZODONE HCL 100 MG PO TABS
100.0000 mg | ORAL_TABLET | Freq: Every evening | ORAL | Status: DC | PRN
  Administered 2021-08-12 – 2021-08-15 (×4): 100 mg via ORAL
  Filled 2021-08-12 (×5): qty 1

## 2021-08-12 NOTE — Group Note (Signed)

## 2021-08-13 LAB — URINALYSIS, COMPLETE (UACMP) WITH MICROSCOPIC
Bacteria, UA: NONE SEEN
Bilirubin Urine: NEGATIVE
Glucose, UA: NEGATIVE mg/dL
Hgb urine dipstick: NEGATIVE
Ketones, ur: NEGATIVE mg/dL
Leukocytes,Ua: NEGATIVE
Nitrite: NEGATIVE
Protein, ur: NEGATIVE mg/dL
Specific Gravity, Urine: 1.015 (ref 1.005–1.030)
pH: 6 (ref 5.0–8.0)

## 2021-08-13 MED ORDER — BUTALBITAL-APAP-CAFFEINE 50-325-40 MG PO TABS
1.0000 | ORAL_TABLET | Freq: Once | ORAL | Status: AC
  Administered 2021-08-13: 1 via ORAL
  Filled 2021-08-13: qty 1

## 2021-08-13 MED ORDER — OXCARBAZEPINE 300 MG PO TABS
300.0000 mg | ORAL_TABLET | Freq: Two times a day (BID) | ORAL | Status: DC
  Administered 2021-08-13 – 2021-08-17 (×8): 300 mg via ORAL
  Filled 2021-08-13: qty 28
  Filled 2021-08-13 (×6): qty 1
  Filled 2021-08-13: qty 28
  Filled 2021-08-13 (×2): qty 1

## 2021-08-13 NOTE — [facility] Group Notes (Signed)
.  Psychoeducational Group Note  Date 08/13/2020 Time: 0900-1000    Goal Setting   Purpose of Group: Group Focus: affirmation, clarity of thought, and goals/reality orientation Treatment Modality:  Psychoeducation Interventions utilized were assignment, group exercise, and support  Purpose: To be able to understand and verbalize the reason for their admission to the hospital. To understand that the medication helps with their chemical imbalance but they also need to work on their choices in life. To be challenged to develop a list of 30 positives about themselves. Also introduce the concept that "feelings" are not reality.    Participation Level:  did not attend  Dione Housekeeper

## 2021-08-14 DIAGNOSIS — F332 Major depressive disorder, recurrent severe without psychotic features: Principal | ICD-10-CM

## 2021-08-14 MED ORDER — SUMATRIPTAN SUCCINATE 25 MG PO TABS
25.0000 mg | ORAL_TABLET | Freq: Four times a day (QID) | ORAL | Status: DC | PRN
  Administered 2021-08-15: 25 mg via ORAL
  Filled 2021-08-14: qty 1

## 2021-08-14 MED ORDER — SENNOSIDES-DOCUSATE SODIUM 8.6-50 MG PO TABS
1.0000 | ORAL_TABLET | Freq: Two times a day (BID) | ORAL | Status: DC
  Administered 2021-08-14 – 2021-08-15 (×2): 1 via ORAL
  Filled 2021-08-14 (×5): qty 1

## 2021-08-14 NOTE — Progress Notes (Addendum)
[facility] MD Progress Note  08/14/2021 4:29 PM Whitney Simpson  MRN:  [id number] Reason for admission: Patient is a 31 year old female with past psychiatric history of PTSD, MDD, anxiety  initially presented to [facility] ED due to suicidal and homicidal ideations. She was assessed by psychiatry and was recommended for inpatient psychiatric admission.   Chart review from last 24 hours-The patient's chart was reviewed and nursing notes were reviewed. Patient discussed in progression rounds with treatment team. MAR was reviewed and Pt is complaint  with scheduled medications and required PRN hydroxyzine for anxiety,but received trazodone x1 for insomnia last night. She received Fiorecet x 1 dose for migraines. Patient was given a list of housing resources available to sex offenders Friday, but reportedly has not called due to it being the weekend. She has been attending some groups and actively participating, but has been spending most of her time in her room doing puzzles.  Pt is seen  today in her room on the 400 hall continuing to sit on the floor doing puzzles. She presents during this encounter with a flat affect and depressed mood, but mood is slightly improved from yesterday. Her attention to personal hygiene and grooming is fair today and eye contact is fair. Speech is clear & coherent. Thought contents are organized and logical, and pt currently denies SI/AVH or paranoia. There is no evidence of delusional thoughts. She however verbalizes +HI towards the other patients in the hall with the exception of her room mate. She reports that they picked up her phone call when her girlfriend, was calling, and GF is currently angry at her. Pt denied any plan or intent to act on her thoughts. Nursing staff is aware and monitoring patients Q15 minutes and staff remain in the day room whenever patients are in the day rooms.  Pt reports a good sleep quality last night, and reports a good appetite. She currently denies any  current medication related side effects. EKG today shows "Sinus Bradycardia" at 56 BPM and QTC is 443 (placed on pt's chart). Increased dose of pt's trileptal to 300 mg BID for mood stabilization yesterday, and she is tolerating this medication well. Pt received a one time dose of Fioricet last night for a migraine, and requested more Fioricet. Pt has been educated that no more of this medication will be ordered during this hospitalization. Order placed for Imitrex PRN for migraines. She complained of no BM x 3 days. Order placed for Senna-colace BID until she has a bowel movement, then will change to Colace daily. Will continue other medications as listed below. Labs reviewed, baseline UA ordered and WNL.     Principal Problem: MDD (major depressive disorder), recurrent severe, without psychosis (HCC) Diagnosis: Principal Problem:   MDD (major depressive disorder), recurrent severe, without psychosis (HCC) Active Problems:   Cannabis use disorder   PTSD (post-traumatic stress disorder)  Total Time spent with patient: 20 minutes  Past Psychiatric History: See H&P  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     Social History   Substance and Sexual Activity  Drug Use Yes   Types: Marijuana    Social History   Socioeconomic History   Marital status: Single    Spouse name: Not on file   Number of children: Not on file   Years of education: Not on file   Highest education level: Not on file  Occupational History   Not on file  Tobacco Use   Smoking status: Never   Smokeless tobacco: Never  Vaping Use   Vaping Use: Never used  Substance and Sexual Activity   Alcohol use: Yes    Comment: ocass.   Drug use: Yes    Types: Marijuana   Sexual activity: Yes    Birth control/protection: None  Other Topics Concern   Not on file  Social History Narrative   Not on file   Social Determinants of Health   Financial Resource Strain: Not on file  Food Insecurity: Not on file  Transportation Needs: Not on file  Physical Activity: Not on file  Stress: Not on file  Social Connections: Not on file   Additional Social History:     Sleep: Fair  Appetite: Improving  Current Medications: Current Facility-Administered Medications  Medication Dose Route Frequency Provider Last Rate Last Admin   acetaminophen (TYLENOL) tablet 650 mg  650 mg Oral Q6H PRN Sindy Guadeloupe, NP   650 mg at 08/13/21 1254   alum & mag hydroxide-simeth (MAALOX/MYLANTA) 200-200-20 MG/5ML suspension 30 mL  30 mL Oral Q4H PRN Sindy Guadeloupe, NP       escitalopram (LEXAPRO) tablet 10 mg  10 mg Oral Daily Mason Jim, Amy E, MD   10 mg at 08/14/21 8119   hydrOXYzine (ATARAX) tablet 25 mg  25 mg Oral TID PRN Karsten Ro, MD   25 mg at 08/13/21 1121   magnesium hydroxide (MILK OF MAGNESIA) suspension 30 mL  30 mL Oral Daily PRN Sindy Guadeloupe, NP   30 mL at 08/14/21 1621   naproxen (NAPROSYN) tablet 500 mg  500 mg Oral Q12H PRN Karsten Ro, MD   500 mg at 08/14/21 1621   Oxcarbazepine (TRILEPTAL) tablet 300 mg  300 mg Oral BID Starleen Blue, NP   300 mg  at 08/14/21 1619   senna-docusate (Senokot-S) tablet 1 tablet  1 tablet Oral BID Starleen Blue, NP       SUMAtriptan (IMITREX) tablet 25 mg  25 mg Oral Q6H PRN Starleen Blue, NP       traZODone (DESYREL) tablet 100 mg  100 mg Oral QHS PRN Bobbitt, Shalon E, NP   100 mg at 08/13/21 2117    Lab Results:  Results for orders placed or performed during the hospital encounter of 08/11/21 (from the past 48 hour(s))  Urinalysis, Complete w Microscopic Urine, Clean Catch     Status: None   Collection Time: 08/13/21  7:40 PM  Result Value Ref Range  Color, Urine YELLOW YELLOW   APPearance CLEAR CLEAR   Specific Gravity, Urine 1.015 1.005 - 1.030   pH 6.0 5.0 - 8.0   Glucose, UA NEGATIVE NEGATIVE mg/dL   Hgb urine dipstick NEGATIVE NEGATIVE   Bilirubin Urine NEGATIVE NEGATIVE   Ketones, ur NEGATIVE NEGATIVE mg/dL   Protein, ur NEGATIVE NEGATIVE mg/dL   Nitrite NEGATIVE NEGATIVE   Leukocytes,Ua NEGATIVE NEGATIVE   RBC / HPF 0-5 0 - 5 RBC/hpf   WBC, UA 0-5 0 - 5 WBC/hpf   Bacteria, UA NONE SEEN NONE SEEN   Squamous Epithelial / LPF 6-10 0 - 5   Mucus PRESENT     Comment: Performed at [facility], [address] [place], [place] [address]    Blood Alcohol level:  Lab Results  Component Value Date   [facility] <10 08/10/2021   ETH <11 04/17/2013    Metabolic Disorder Labs: Lab Results  Component Value Date   HGBA1C 5.2 08/12/2021   MPG 102.54 08/12/2021   No results found for: "PROLACTIN" Lab Results  Component Value Date   CHOL 163 08/12/2021   TRIG 119 08/12/2021   HDL 52 08/12/2021   CHOLHDL 3.1 08/12/2021   VLDL 24 08/12/2021   LDLCALC 87 08/12/2021   Physical Findings:  Musculoskeletal: Strength & Muscle Tone: within normal limits Gait & Station: normal Patient leans: N/A  Psychiatric Specialty Exam:  Presentation  General Appearance: casually dressed, adequate hygiene  Eye Contact:Poor  Speech:Clear and Coherent  Speech  Volume:Normal  Handedness:Right  Mood and Affect  Mood:Angry; Depressed  Affect:constricted  Thought Process  Thought Processes:Coherent  Descriptions of Associations:Intact  Orientation:Full (Time, Place and Person)  Thought Content:Denies SI, HI, AVH or paranoia  Hallucinations:Hallucinations: None  Ideas of Reference:None  Suicidal Thoughts:Suicidal Thoughts: No  Homicidal Thoughts:Homicidal Thoughts: No  Sensorium  Memory:Immediate Good  Judgment:Improving   Insight:Poor; Fair  Art therapist  Concentration:Poor  Attention Span:Fair  Recall:Fair  [employer] of Knowledge:Fair  Language:Fair  Psychomotor Activity  Psychomotor Activity:Psychomotor Activity: Normal  Assets  Assets:Communication Skills  Sleep  Sleep:Sleep: Good  Physical Exam: Physical Exam Vitals and nursing note reviewed.  HENT:     Head: Normocephalic.  Pulmonary:     Effort: Pulmonary effort is normal.  Neurological:     Mental Status: She is alert.    Review of Systems  Respiratory:  Negative for cough and shortness of breath.   Cardiovascular:  Negative for chest pain.  Gastrointestinal:  Negative for abdominal pain, constipation, diarrhea, nausea and vomiting.  Neurological:  Positive for headaches. Negative for dizziness and weakness.  Psychiatric/Behavioral:  Positive for depression. Negative for hallucinations and suicidal ideas. The patient is nervous/anxious. The patient does not have insomnia.    Blood pressure (!) 132/94, pulse (!) 59, temperature 98.1 F (36.7 C), temperature source Oral, resp. rate 18, height 5\' 4"  (1.626 m), weight 82.5 kg, SpO2 100 %, unknown if currently breastfeeding. Body mass index is 31.21 kg/m.  Treatment Plan Summary: Daily contact with patient to assess and evaluate symptoms and progress in treatment Patient is a 31 year old female with past psychiatric history of PTSD, MDD, anxiety  initially presented to [facility] ED due to suicidal and  homicidal ideations. She was assessed by psychiatry and was recommended for inpatient psychiatric admission.  Patient was admitted to [facility] adult unit on 08/10/2021.    Patient symptoms are consistent with diagnosis of MDD.  Patient also reports some vague manic type symptoms but has always been on marijuana.  Bipolar disorder cannot be ruled out. Patient reports improvement in her sleep and appetite but still reporting depression and anxiety.  Denies SI, HI, AVH.  She is also reporting migraine headache.  Will add naproxen.   Labs reviewed  CBC-WNL CMP - WNL HbA1c 5.2 Lipid Panel WNL UDS- + for THC Preg test negative TSH-1.4 Respiratory panel -Influenza A and B negative, Covid Negative Ethanol level <10  Salicylate level <7  Acetaminophen level <10  EKG-pending   DDX MDD, recurrent, severe without psychotic features (R/o bipolar disorder vs SIMD) PTSD by hx -Continue Trileptal to 300 mg twice daily for irritability and mood stabilization.(Discussed need to monitor Na+ on this med)-will recheck CMP within the upcoming week. -Start Imitrex 2 mg Q6 H PRN for migraines- Educated that Fioricet will no longer be ordered for the rest of hospital stay -Start Senna-docusate BID for constipation-change to colace daily after BM -Continue Lexapro 10 mg daily for depression and anxiety. (R/b/se/a discussed and patient agrees with medication trial   Cannabis use disorder -Encouraged cessation   PRN's  -Continue Tylenol 650 mg every 6 hours as needed for pain or fever -Continue Milk of Magnesia 30 ml PRN Daily for Constipation. -Continue Maalox/Mylanta 30 ml Q4H PRN for Indigestion. -Continue Hydroxyzine 25 mg TID PRN for Anxiety. -Continue Trazodone 50 mg QHS PRN for sleep.    Starleen Blue, NP 08/14/2021, 4:29 PMPatient ID: Whitney Simpson, female   DOB: [birth year], 31 y.o.   MRN: [id number] Patient ID: Whitney Simpson, female   DOB: May 29, 1990, 29 y.o.   MRN: [id number]

## 2021-08-14 NOTE — Progress Notes (Signed)
Patient resting in bed with no acute distress. Denies current SI/HI, denies A/V hallucinations. Patient presents with a labile effect. No pain reported at present. Will continue q28min checks and provide verbal encouragement.

## 2021-08-14 NOTE — Progress Notes (Signed)
Patient has been observed up in the dayroom playing cards with peers. Writer spoke with her 1:1 and she is very concerned about seeking treatment after discharge. She reports that [facility] is not good for her and feels she needs to relocate in order to be successful with treatment. She would like to go to [facility] and her probation officer recommended this as well. She is hopeful that her social worker will contact her probation officer Emilia Beck at [phone]. She was compliant with her medications tonight. Safety maintained on unit with 15 min checks.

## 2021-08-14 NOTE — [facility] Group Notes (Signed)
Adult Psychoeducational Group Note Date:  08/14/2021 Time:  0900-1045 Group Topic/Focus: PROGRESSIVE RELAXATION. A group where deep breathing is taught and tensing and relaxation muscle groups is used. Imagery is used as well.  Pts are asked to imagine 3 pillars that hold them up when they are not able to hold themselves up and to share that with the group.  Participation Level:  Active  Participation Quality:  Appropriate  Affect:  Appropriate  Cognitive:  Oriented  Insight: Improving  Engagement in Group:  Engaged  Modes of Intervention:  Activity, Discussion, Education, and Support  Additional Comments:  Rates her energy at a 5. States her daughters, her spirituality and music hold her up.  Dione Housekeeper

## 2021-08-15 DIAGNOSIS — F4310 Post-traumatic stress disorder, unspecified: Secondary | ICD-10-CM | POA: Diagnosis not present

## 2021-08-15 MED ORDER — QUETIAPINE FUMARATE 50 MG PO TABS
50.0000 mg | ORAL_TABLET | Freq: Every day | ORAL | Status: DC
  Administered 2021-08-15: 50 mg via ORAL
  Filled 2021-08-15 (×3): qty 1

## 2021-08-15 MED ORDER — BISACODYL 5 MG PO TBEC
10.0000 mg | DELAYED_RELEASE_TABLET | Freq: Once | ORAL | Status: AC
Start: 2021-08-15 — End: 2021-08-15
  Administered 2021-08-15: 10 mg via ORAL
  Filled 2021-08-15: qty 2

## 2021-08-15 MED ORDER — DOCUSATE SODIUM 100 MG PO CAPS
200.0000 mg | ORAL_CAPSULE | Freq: Every day | ORAL | Status: DC
  Administered 2021-08-15 – 2021-08-16 (×2): 200 mg via ORAL
  Filled 2021-08-15 (×4): qty 2

## 2021-08-15 NOTE — Progress Notes (Signed)
Whitney Simpson asked if writer can come to her room to take her blood pressure. Whitney Simpson appear to be irriatable and crumby. Writer asked if I can help or something I can do [name] said no.A RN was notify.

## 2021-08-15 NOTE — Progress Notes (Signed)
Jamiracle sitting eatting extra cafeteria. Writer explain to [place] she can not bring food back from [employer] and she can not take two cups coffee back to her room. Carrianne said every day she brings back two cups of coffee. I explain that not supposed to happen. Becky walks out the cafeteria with coffee to her room. RN notify.

## 2021-08-15 NOTE — Progress Notes (Signed)
Patient reports she had a large BM after the medications she took this morning (Dulcolax, Colace).

## 2021-08-15 NOTE — Group Note (Signed)
LCSW Group Therapy Note   Group Date: 08/15/2021 Start Time: 1300 End Time: 1400  Type of Therapy and Topic:  Group Therapy:  triggers  Participation Level:  Active   Description of Group:   Recognizing Triggers: Patients defined triggers and discussed the importance of recognizing their personal warning signs. Patients identified their own triggers and how they tend to cope with stressful situations. Patients discussed areas such as people, places, things, and thoughts that rigger certain emotions for them. CSW provided support to patients and discussed safety planning for when these triggers occur. Group participants had opportunities to share openly with the group and participate in a group discussion while providing support and feedback to their peers.  Therapeutic Goals: Patient will identify triggers that are contributing to a problem in their life Patient will identify unwanted behaviors and feelings associated with a trigger.  Patient will share with other group members strategies to confront and avoid triggers so that they may be able to react appropriately to triggers in daily life.    Summary of Patient Progress:   The Pt states that a trigger for them is other people lying.  The Pt came to group and remained there the entire time.  The Pt accepted all handouts and worksheets.  The Pt was able to discuss their triggers and a way to both avoid those triggers and cope with them.  The Pt was open to to information to discussion and was appropriate with their peers.     Therapeutic Modalities:   Cognitive Behavioral Therapy Solution Focused Therapy Motivational Interviewing Family Systems Approach  Aram Beecham, [place] 08/15/2021  2:07 PM

## 2021-08-15 NOTE — Plan of Care (Signed)
Care plan note:    Problem: Education: Goal: Knowledge of [facility] Education information/materials will improve Outcome: Progressing Goal: Emotional status will improve Outcome: Progressing Goal: Mental status will improve Outcome: Progressing Goal: Verbalization of understanding the information provided will improve Outcome: Progressing   Problem: Activity: Goal: Interest or engagement in activities will improve Outcome: Progressing Goal: Sleeping patterns will improve Outcome: Progressing   Problem: Coping: Goal: Ability to verbalize frustrations and anger appropriately will improve Outcome: Progressing Goal: Ability to demonstrate self-control will improve Outcome: Progressing   Problem: Health Behavior/Discharge Planning: Goal: Identification of resources available to assist in meeting health care needs will improve Outcome: Progressing Goal: Compliance with treatment plan for underlying cause of condition will improve Outcome: Progressing   Problem: Physical Regulation: Goal: Ability to maintain clinical measurements within normal limits will improve Outcome: Progressing   Problem: Safety: Goal: Periods of time without injury will increase Outcome: Progressing

## 2021-08-16 MED ORDER — SUMATRIPTAN SUCCINATE 25 MG PO TABS
25.0000 mg | ORAL_TABLET | ORAL | 0 refills | Status: AC | PRN
Start: 2021-08-16 — End: ?

## 2021-08-16 MED ORDER — TRAZODONE HCL 100 MG PO TABS: 100.0000 mg | ORAL_TABLET | Freq: Every evening | ORAL | 0 refills | Status: DC | PRN

## 2021-08-16 MED ORDER — QUETIAPINE FUMARATE 100 MG PO TABS
100.0000 mg | ORAL_TABLET | Freq: Every day | ORAL | Status: DC
  Administered 2021-08-16: 100 mg via ORAL
  Filled 2021-08-16: qty 1
  Filled 2021-08-16: qty 14

## 2021-08-16 MED ORDER — SUMATRIPTAN SUCCINATE 25 MG PO TABS: 25.0000 mg | ORAL_TABLET | ORAL | Status: DC | PRN

## 2021-08-16 MED ORDER — QUETIAPINE FUMARATE 100 MG PO TABS: 100.0000 mg | ORAL_TABLET | Freq: Every day | ORAL | 0 refills | Status: DC

## 2021-08-16 MED ORDER — ESCITALOPRAM OXALATE 10 MG PO TABS: 10.0000 mg | ORAL_TABLET | Freq: Every day | ORAL | 0 refills | Status: DC

## 2021-08-16 MED ORDER — OXCARBAZEPINE 300 MG PO TABS
300.0000 mg | ORAL_TABLET | Freq: Two times a day (BID) | ORAL | 0 refills | Status: DC
Start: 2021-08-16 — End: 2021-10-18

## 2021-08-16 NOTE — Discharge Summary (Signed)
Physician Discharge Summary Note  Patient:  Whitney Simpson is an 31 y.o., female MRN:  [id number] DOB:  1990-10-10 Patient phone:  [phone] (home)  Patient address:   [address] [place] [address],  Total Time spent with patient: 15 minutes  Date of Admission:  08/11/2021 Date of Discharge: 08/17/2021  Reason for Admission:   Patient is a 31 year old female with past psychiatric history of PTSD, MDD, anxiety  initially presented to [facility] ED due to suicidal and homicidal ideations. She was assessed by psychiatry and was recommended for inpatient psychiatric admission.  Patient was admitted to [facility] H adult unit on 08/10/2021. Pt has a history of registered sex trafficker and sex offender and currently on probation for sex trafficking.  Patient states she was having suicidal thoughts and homicidal thoughts towards her girlfriend and GF's Ex. she reports that she was having thoughts of running into truck.  She reports that she is tired of living as she is a registered sex offender and on registry, homeless, do not have custody of her daughter, has to pay child support and her GF is pressing fake charges against her.  She reports that she is pressing charges because she kicked her door.  She reports that she does not have any support system.  She states her mom does not have any money and lives with her boyfriend and her grandmother has 1 bedroom apartment.  She reports that she cannot go there as she is retired and she already got her a car.  She reports that because of sex offender registry she is not able to get appropriate jobs.   Principal Problem: MDD (major depressive disorder), recurrent severe, without psychosis (HCC) Discharge Diagnoses: Principal Problem:   MDD (major depressive disorder), recurrent severe, without psychosis (HCC) Active Problems:   Cannabis use disorder   PTSD (post-traumatic stress disorder)   Past Psychiatric History: MDD, PTSD, and Anxiety, one  Suicide Attempt (2015 OD), and a hospitalization [facility] 2015).  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   Procedure: TOTAL HIP ARTHROPLASTY ANTERIOR APPROACH;  Surgeon: Jodi Geralds, MD;  Location: [facility] ORS;  Service: Orthopedics;  Laterality: Right;   Family History:  Family History  Problem Relation Age of Onset   Hypertension Mother    Hypertension Father    Diabetes Other    Hypertension Other    Anesthesia problems Neg Hx    Hypotension Neg Hx    Malignant hyperthermia Neg Hx    Pseudochol deficiency Neg Hx    Family Psychiatric  History:  Mother- Bipolar Disorder, Depression, Suicide Attempt, THC Use Father- Bipolar Disorder Social History:  Social History   Substance and Sexual Activity  Alcohol Use Yes   Comment: ocass.     Social History   Substance and Sexual Activity  Drug Use Yes   Types: Marijuana    Social History   Socioeconomic History   Marital status: Single    Spouse name: Not on file   Number of children: Not on file   Years of education: Not on file  Highest education level: Not on file  Occupational History   Not on file  Tobacco Use   Smoking status: Never   Smokeless tobacco: Never  Vaping Use   Vaping Use: Never used  Substance and Sexual Activity   Alcohol use: Yes    Comment: ocass.   Drug use: Yes    Types: Marijuana   Sexual activity: Yes    Birth control/protection: None  Other Topics Concern   Not on file  Social History Narrative   Not on file   Social Determinants of Health   Financial Resource Strain: Not on file  Food Insecurity: Not on file  Transportation Needs: Not on file  Physical Activity: Not on file  Stress:  Not on file  Social Connections: Not on file    Hospital Course:   During the patient's hospitalization, patient had extensive initial psychiatric evaluation, and follow-up psychiatric evaluations every day.  Psychiatric diagnoses provided upon initial assessment: MDD (major depressive disorder), recurrent severe, without psychosis  Patient's psychiatric medications were adjusted on admission: She was started on Lexapro.  Her home Trileptal was continued.  During the hospitalization, other adjustments were made to the patient's psychiatric medication regimen: Her Trileptal was titrated during admission.  Due to her continued mood issues and issues with sleep and appetite she was started on Seroquel.   Patient's care was discussed during the interdisciplinary team meeting every day during the hospitalization.  The patient is not having side effects to prescribed psychiatric medication.  Gradually, patient started adjusting to milieu. The patient was evaluated each day by a clinical provider to ascertain response to treatment. Improvement was noted by the patient's report of decreasing symptoms, improved sleep and appetite, affect, medication tolerance, behavior, and participation in unit programming.  Patient was asked each day to complete a self inventory noting mood, mental status, pain, new symptoms, anxiety and concerns.   Symptoms were reported as significantly decreased or resolved completely by discharge.  The patient reports that their mood is stable.  The patient denied having suicidal thoughts for more than 48 hours prior to discharge.  Patient denies having homicidal thoughts.  Patient denies having auditory hallucinations.  Patient denies any visual hallucinations or other symptoms of psychosis.  The patient was motivated to continue taking medication with a goal of continued improvement in mental health.   The patient reports their target psychiatric symptoms of depression and SI  responded well to the psychiatric medications, and the patient reports overall benefit other psychiatric hospitalization. Supportive psychotherapy was provided to the patient. The patient also participated in regular group therapy while hospitalized. Coping skills, problem solving as well as relaxation therapies were also part of the unit programming.  Labs were reviewed with the patient, and abnormal results were discussed with the patient.  The patient is able to verbalize their individual safety plan to this provider.  # It is recommended to the patient to continue psychiatric medications as prescribed, after discharge from the hospital.    # It is recommended to the patient to follow up with your outpatient psychiatric provider and PCP.  # It was discussed with the patient, the impact of alcohol, drugs, tobacco have been there overall psychiatric and medical wellbeing, and total abstinence from substance use was recommended the patient.ed.  # Prescriptions provided or sent directly to preferred pharmacy at discharge. Patient agreeable to plan. Given opportunity to ask questions. Appears to feel comfortable with discharge.    # In the event of worsening symptoms,  the patient is instructed to call the crisis hotline, 911 and or go to the nearest ED for appropriate evaluation and treatment of symptoms. To follow-up with primary care provider for other medical issues, concerns and or health care needs  # Patient was discharged to [facility] with a plan to follow up as noted below.    On day of discharge she reports that she is a little anxious about going to [facility].  She reports she slept well last night.  She reports her appetite is doing good.  She reports no SI, HI, or AVH.  She reports no Parnoia, Ideas of Reference, or other First Rank symptoms.  She reports no issues with her medications.  Discussed that since she is on an antipsychotic she will need routine monitoring of- weight,  A1c, Lipid Panel, CMP, CBC, and EKG.  Discussed if she notices any abnormal muscle movements/twitching she needs to contact her outpatient provider immediately.  Discussed with her what to do in the event of a future crisis.  Discussed that she can return to [facility], go to the [facility], go to the nearest ED, or call 911 or 988.   She reported understanding and had no concerns.   Physical Findings: AIMS: Facial and Oral Movements Muscles of Facial Expression: None, normal Lips and Perioral Area: None, normal Jaw: None, normal Tongue: None, normal,Extremity Movements Upper (arms, wrists, hands, fingers): None, normal Lower (legs, knees, ankles, toes): None, normal, Trunk Movements Neck, shoulders, hips: None, normal, Overall Severity Severity of abnormal movements (highest score from questions above): None, normal Incapacitation due to abnormal movements: None, normal Patient's awareness of abnormal movements (rate only patient's report): No Awareness, Dental Status Current problems with teeth and/or dentures?: No Does patient usually wear dentures?: No  No Cogwheeling or Rigidity Present  Musculoskeletal: Strength & Muscle Tone: within normal limits Gait & Station: normal Patient leans: N/A   Psychiatric Specialty Exam:  Presentation  General Appearance: Appropriate for Environment; Casual  Eye Contact:Good  Speech:Clear and Coherent; Normal Rate  Speech Volume:Normal  Handedness:Right   Mood and Affect  Mood:Dysphoric  Affect:Appropriate; Congruent   Thought Process  Thought Processes:Coherent; Goal Directed  Descriptions of Associations:Intact  Orientation:Full (Time, Place and Person)  Thought Content:Logical No SI, HI, or AVH. No Paranoia, Ideas of Reference, or First Rank symptoms.  History of Schizophrenia/Schizoaffective disorder:No data recorded Duration of Psychotic Symptoms:No data recorded Hallucinations:Hallucinations: None  Ideas of  Reference:None  Suicidal Thoughts:Suicidal Thoughts: No  Homicidal Thoughts:Homicidal Thoughts: No   Sensorium  Memory:Immediate Fair; Recent Fair  Judgment:Fair  Insight:Fair   Executive Functions  Concentration:Good  Attention Span:Good  Recall:Good  Fund of Knowledge:Good  Language:Good   Psychomotor Activity  Psychomotor Activity:Psychomotor Activity: Normal   Assets  Assets:Communication Skills; Resilience   Sleep  Sleep:Sleep: Fair Number of Hours of Sleep: 5.25    Physical Exam: Physical Exam Vitals and nursing note reviewed.  Constitutional:      General: She is not in acute distress.    Appearance: Normal appearance. She is normal weight. She is not ill-appearing or toxic-appearing.  HENT:     Head: Normocephalic and atraumatic.  Pulmonary:     Effort: Pulmonary effort is normal.  Musculoskeletal:        General: Normal range of motion.  Neurological:     General: No focal deficit present.     Mental Status: She is alert.    Review of Systems  Respiratory:  Negative for cough and shortness of breath.  Cardiovascular:  Negative for chest pain.  Gastrointestinal:  Negative for abdominal pain, constipation, diarrhea, nausea and vomiting.  Neurological:  Negative for dizziness, weakness and headaches.  Psychiatric/Behavioral:  Negative for depression, hallucinations and suicidal ideas. The patient is nervous/anxious (mild).    Blood pressure (!) 135/95, pulse 79, temperature 98.4 F (36.9 C), temperature source Oral, resp. rate 20, height 5\' 4"  (1.626 m), weight 82.5 kg, SpO2 100 %, unknown if currently breastfeeding. Body mass index is 31.21 kg/m.   Social History   Tobacco Use  Smoking Status Never  Smokeless Tobacco Never   Tobacco Cessation:  N/A, patient does not currently use tobacco products   Blood Alcohol level:  Lab Results  Component Value Date   [facility] <10 08/10/2021   ETH <11 04/17/2013    Metabolic Disorder Labs:   Lab Results  Component Value Date   HGBA1C 5.2 08/12/2021   MPG 102.54 08/12/2021   No results found for: "PROLACTIN" Lab Results  Component Value Date   CHOL 163 08/12/2021   TRIG 119 08/12/2021   HDL 52 08/12/2021   CHOLHDL 3.1 08/12/2021   VLDL 24 08/12/2021   LDLCALC 87 08/12/2021    See Psychiatric Specialty Exam and Suicide Risk Assessment completed by Attending Physician prior to discharge.  Discharge destination:  [facility]  Is patient on multiple antipsychotic therapies at discharge:  No   Has Patient had three or more failed trials of antipsychotic monotherapy by history:  No  Recommended Plan for Multiple Antipsychotic Therapies: NA  Discharge Instructions     Diet - low sodium heart healthy   Complete by: As directed    Increase activity slowly   Complete by: As directed       Allergies as of 08/17/2021       Reactions   Penicillins Anaphylaxis, Hives   Throat closing and hives   Beef-derived Products    Chicken Protein    Fish-derived Products    Pork-derived Products         Medication List     STOP taking these medications    acetaminophen 325 MG tablet Commonly known as: TYLENOL   aspirin EC 325 MG tablet   docusate sodium 100 MG capsule Commonly known as: Colace   hydrOXYzine 25 MG capsule Commonly known as: VISTARIL   methocarbamol 500 MG tablet Commonly known as: ROBAXIN   oxyCODONE-acetaminophen 5-325 MG tablet Commonly known as: PERCOCET/ROXICET   tiZANidine 2 MG tablet Commonly known as: ZANAFLEX       TAKE these medications      Indication  escitalopram 10 MG tablet Commonly known as: LEXAPRO Take 1 tablet (10 mg total) by mouth daily.  Indication: Major Depressive Disorder, Posttraumatic Stress Disorder   Oxcarbazepine 300 MG tablet Commonly known as: TRILEPTAL Take 1 tablet (300 mg total) by mouth 2 (two) times daily. What changed:  medication strength how much to take  Indication: mood  stabilization   QUEtiapine 100 MG tablet Commonly known as: SEROQUEL Take 1 tablet (100 mg total) by mouth at bedtime.  Indication: Major Depressive Disorder   SUMAtriptan 25 MG tablet Commonly known as: IMITREX Take 1 tablet (25 mg total) by mouth as needed for migraine. May repeat in 2 hours if headache persists or recurs.  Indication: Migraine Headache   traZODone 100 MG tablet Commonly known as: DESYREL Take 1 tablet (100 mg total) by mouth at bedtime as needed for up to 15 days for sleep.  Indication: Trouble Sleeping  Follow-up Information     [facility] Follow up on 08/18/2021.   Why: You have been scheduled for a hospital follow-up appointment on 08/18/2021 at 1:00pm for therapy and medication management services. Contact information: [address]   [address] [place], [place]  [address]  Phone: [phone] Fax: [phone]        Services, [facility] Follow up on 08/17/2021.   Why: You [facility] on 08/17/2021 at 9:00am. Contact information: [address] [place] [address] [phone]                 Follow-up recommendations/Comments:    Activity: as tolerated   Diet: heart healthy   Other: -Follow-up with your outpatient psychiatric provider -instructions on appointment date, time, and address (location) are provided to you in discharge paperwork.   -Take your psychiatric medications as prescribed at discharge - instructions are provided to you in the discharge paperwork   -Follow-up with outpatient primary care doctor and other specialists -for management of chronic medical disease, including: You will need routine monitoring of your- weight, A1c, Lipid Panel, CMP, CBC, and EKG because of your antipsychotic.   -Testing: Follow-up with outpatient provider for abnormal lab results: None   -Recommend abstinence from alcohol, tobacco, and other illicit drug use at  discharge.    -If your psychiatric symptoms recur, worsen, or if you have side effects to your psychiatric medications, call your outpatient psychiatric provider, 911, [phone] or go to the nearest emergency department.   -If suicidal thoughts recur, call your outpatient psychiatric provider, 911, 988 or go to the nearest emergency department.  Signed: Lauro FranklinAlexander S Morty Ortwein, MD 08/17/2021, 4:56 PM
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
## 2021-08-16 NOTE — Progress Notes (Signed)
  [facility] Adult Case Management Discharge Plan :  Will you be returning to the same living situation after discharge:  No. [facility]  At discharge, do you have transportation home?: Yes,  Taxi  Do you have the ability to pay for your medications: Yes,  Medicaid   Release of information consent forms completed and in the chart;  Patient's signature needed at discharge.  Patient to Follow up at:  Follow-up Information     [facility] Follow up on 08/18/2021.   Why: You have been scheduled for a hospital follow-up appointment on 08/18/2021 at 1:00pm for therapy and medication management services. Contact information: [address] [place]   [address] [place], [place]  [address]  Phone: [phone] Fax: [phone]        [facility] Follow up on 08/17/2021.   Why: You have been accepted for an intake assessment at [facility] on 08/17/2021 at 9:00am. Contact information: [address] [place] [address] [phone]                 Next level of care provider has access to [facility] Link:no  Safety Planning and Suicide Prevention discussed: Yes,  with patient      Has patient been referred to the Quitline?: N/A patient is not a smoker  Patient has been referred for addiction treatment: Yes  Aram Beecham, LCSWA 08/16/2021, 3:39 PM

## 2021-08-17 NOTE — Progress Notes (Signed)
RN met with pt and reviewed pt's discharge instructions. Pt verbalized understanding of discharge instructions and pt did not have any questions. RN reviewed and provided pt with a copy of SRA, AVS and Transition Record. RN returned pt's belongings to pt including belongings in lobby closet that mom brought in to [facility].  Paper prescriptions and  medication samples were given to pt. Cab voucher given to pt. Pt denied SI/HI/AVH and voiced no concerns. Pt was appreciative of the care pt received at [facility]. Patient discharged to the lobby without incident.

## 2021-10-17 ENCOUNTER — Ambulatory Visit (HOSPITAL_COMMUNITY)
Admission: EM | Admit: 2021-10-17 | Discharge: 2021-10-18 | Disposition: A | Discharge status: Home / Self Care | Payer: Medicaid Other | Attending: Psychiatry | Admitting: Psychiatry

## 2021-10-17 DIAGNOSIS — F419 Anxiety disorder, unspecified: Secondary | ICD-10-CM | POA: Insufficient documentation

## 2021-10-17 DIAGNOSIS — Z5902 Unsheltered homelessness: Secondary | ICD-10-CM | POA: Insufficient documentation

## 2021-10-17 DIAGNOSIS — F4310 Post-traumatic stress disorder, unspecified: Secondary | ICD-10-CM | POA: Insufficient documentation

## 2021-10-17 DIAGNOSIS — Z20822 Contact with and (suspected) exposure to covid-19: Secondary | ICD-10-CM | POA: Insufficient documentation

## 2021-10-17 DIAGNOSIS — F29 Unspecified psychosis not due to a substance or known physiological condition: Secondary | ICD-10-CM | POA: Insufficient documentation

## 2021-10-17 DIAGNOSIS — F329 Major depressive disorder, single episode, unspecified: Secondary | ICD-10-CM | POA: Insufficient documentation

## 2021-10-17 NOTE — ED Triage Notes (Addendum)
Pt presents to [facility] voluntarily, unaccompanied at this time with complaints of auditory hallucinations. Pt currently reports hearing voices (whispers), ventilation and a baby crying. Pt also reports having moments where she is extremely confused and has difficulty thinking. Pt is homeless and lives in her car. Pt reports having an out of body experience last night during an argument with her girlfriend. Pt states " I knew I was there in the car, but I felt like I was somewhere else". Pt observed talking to herself at times. Pt reports being linked to [employer] (therapist) and is compliant with prescribed medications. Pt reports hx of PTSD, anxiety and depression. Pt denies SI, HI.

## 2021-10-18 LAB — POCT URINE DRUG SCREEN - MANUAL ENTRY (I-SCREEN)
POC Amphetamine UR: NOT DETECTED
POC Buprenorphine (BUP): NOT DETECTED
POC Cocaine UR: NOT DETECTED
POC Marijuana UR: POSITIVE — AB
POC Methadone UR: NOT DETECTED
POC Methamphetamine UR: NOT DETECTED
POC Morphine: NOT DETECTED
POC Oxazepam (BZO): NOT DETECTED
POC Oxycodone UR: NOT DETECTED
POC Secobarbital (BAR): NOT DETECTED

## 2021-10-18 LAB — CBC WITH DIFFERENTIAL/PLATELET
Abs Immature Granulocytes: 0.03 10*3/uL (ref 0.00–0.07)
Basophils Absolute: 0 10*3/uL (ref 0.0–0.1)
Basophils Relative: 0 %
Eosinophils Absolute: 0.2 10*3/uL (ref 0.0–0.5)
Eosinophils Relative: 2 %
HCT: 37.4 % (ref 36.0–46.0)
Hemoglobin: 12.8 g/dL (ref 12.0–15.0)
Immature Granulocytes: 0 %
Lymphocytes Relative: 22 %
Lymphs Abs: 1.9 10*3/uL (ref 0.7–4.0)
MCH: 31.4 pg (ref 26.0–34.0)
MCHC: 34.2 g/dL (ref 30.0–36.0)
MCV: 91.7 fL (ref 80.0–100.0)
Monocytes Absolute: 0.7 10*3/uL (ref 0.1–1.0)
Monocytes Relative: 9 %
Neutro Abs: 5.5 10*3/uL (ref 1.7–7.7)
Neutrophils Relative %: 67 %
Platelets: 202 10*3/uL (ref 150–400)
RBC: 4.08 MIL/uL (ref 3.87–5.11)
RDW: 12.1 % (ref 11.5–15.5)
WBC: 8.3 10*3/uL (ref 4.0–10.5)
nRBC: 0 % (ref 0.0–0.2)

## 2021-10-18 LAB — COMPREHENSIVE METABOLIC PANEL
ALT: 27 U/L (ref 0–44)
AST: 27 U/L (ref 15–41)
Albumin: 4.1 g/dL (ref 3.5–5.0)
Alkaline Phosphatase: 107 U/L (ref 38–126)
Anion gap: 7 (ref 5–15)
BUN: 12 mg/dL (ref 6–20)
CO2: 26 mmol/L (ref 22–32)
Calcium: 9.6 mg/dL (ref 8.9–10.3)
Chloride: 106 mmol/L (ref 98–111)
Creatinine, Ser: 0.71 mg/dL (ref 0.44–1.00)
GFR, Estimated: >60 mL/min (ref >60)
Glucose, Bld: 88 mg/dL (ref 70–99)
Potassium: 4.1 mmol/L (ref 3.5–5.1)
Sodium: 139 mmol/L (ref 135–145)
Total Bilirubin: 0.4 mg/dL (ref 0.3–1.2)
Total Protein: 6.8 g/dL (ref 6.5–8.1)

## 2021-10-18 LAB — RESP PANEL BY RT-PCR (FLU A&B, COVID) ARPGX2
Influenza A by PCR: NEGATIVE
Influenza B by PCR: NEGATIVE
SARS Coronavirus 2 by RT PCR: NEGATIVE

## 2021-10-18 LAB — LIPID PANEL
Cholesterol: 143 mg/dL (ref 0–200)
HDL: 58 mg/dL (ref >40)
LDL Cholesterol: 73 mg/dL (ref 0–99)
Total CHOL/HDL Ratio: 2.5 RATIO
Triglycerides: 59 mg/dL (ref <150)
VLDL: 12 mg/dL (ref 0–40)

## 2021-10-18 LAB — POC SARS CORONAVIRUS 2 AG: SARSCOV2ONAVIRUS 2 AG: NEGATIVE

## 2021-10-18 LAB — POCT PREGNANCY, URINE: Preg Test, Ur: NEGATIVE

## 2021-10-18 LAB — ETHANOL: Alcohol, Ethyl (B): <10 mg/dL (ref <10)

## 2021-10-18 LAB — TSH: TSH: 0.437 u[IU]/mL (ref 0.350–4.500)

## 2021-10-18 MED ORDER — MAGNESIUM HYDROXIDE 400 MG/5ML PO SUSP: 30.0000 mL | Freq: Every day | ORAL | Status: DC | PRN

## 2021-10-18 MED ORDER — QUETIAPINE FUMARATE 100 MG PO TABS: 100.0000 mg | ORAL_TABLET | Freq: Every day | ORAL | Status: DC

## 2021-10-18 MED ORDER — ESCITALOPRAM OXALATE 10 MG PO TABS
10.0000 mg | ORAL_TABLET | Freq: Every day | ORAL | Status: DC
  Administered 2021-10-18: 10 mg via ORAL
  Filled 2021-10-18: qty 1

## 2021-10-18 MED ORDER — ALUM & MAG HYDROXIDE-SIMETH 200-200-20 MG/5ML PO SUSP: 30.0000 mL | ORAL | Status: DC | PRN

## 2021-10-18 MED ORDER — TRAZODONE HCL 100 MG PO TABS: 100.0000 mg | ORAL_TABLET | Freq: Every evening | ORAL | Status: DC | PRN

## 2021-10-18 MED ORDER — OXCARBAZEPINE 300 MG PO TABS
300.0000 mg | ORAL_TABLET | Freq: Two times a day (BID) | ORAL | Status: DC
  Administered 2021-10-18: 300 mg via ORAL
  Filled 2021-10-18: qty 1

## 2021-10-18 MED ORDER — OXCARBAZEPINE 300 MG PO TABS: 300.0000 mg | ORAL_TABLET | Freq: Two times a day (BID) | ORAL | 0 refills | Status: AC

## 2021-10-18 MED ORDER — QUETIAPINE FUMARATE 100 MG PO TABS
100.0000 mg | ORAL_TABLET | Freq: Every day | ORAL | 0 refills | Status: AC
Start: 2021-10-18 — End: 2021-11-17

## 2021-10-18 MED ORDER — ACETAMINOPHEN 325 MG PO TABS
650.0000 mg | ORAL_TABLET | Freq: Four times a day (QID) | ORAL | Status: DC | PRN
  Administered 2021-10-18: 650 mg via ORAL
  Filled 2021-10-18: qty 2

## 2021-10-18 MED ORDER — ESCITALOPRAM OXALATE 10 MG PO TABS
10.0000 mg | ORAL_TABLET | Freq: Every day | ORAL | 0 refills | Status: AC
Start: 2021-10-18 — End: 2021-11-17

## 2021-10-18 NOTE — ED Notes (Signed)
Pt is currently on the phone. 

## 2021-10-18 NOTE — ED Provider Notes (Cosign Needed Addendum)
FBC/OBS ASAP Discharge Summary  Date and Time: 10/18/2021 10:48 AM  Name: Whitney Simpson  MRN:  [id number]   Discharge Diagnoses:  Final diagnoses:  Psychosis, unspecified psychosis type [facility])    Subjective: Patient is seen and reevaluated face-to-face by this provider, chart reviewed and case discussed with Dr. Lucianne Muss. On evaluation, patient is alert and oriented x 4. Her thought process is logical and linear. Her speech is clear and coherent at a moderate tone. She has fair eye contact.Today, patient denies suicidal and homicidal ideations. She currently denies auditory and visual hallucinations. There is no objective evidence that the patient is currently responding to internal or external stimuli or experiencing delusional or paranoid thought content. She states that she has been experiencing hallucinations off and on since May 2023. She is unable to specify what her most recent episode of hallucinations began. I discussed with the patient and reviewed her past psychiatric history with her, which does not include a history of psychosis. I discussed with the patient while hospitalized at [facility] behavioral health from 08/11/2021 to 08/18/2021, that there was no indication of psychosis or reported hallucinations. I reviewed the patient's substance use history with her.  Patient states that she was sober for 7 days up until she relapsed on marijuana yesterday. She reports smoking 2 blunts ( marijuana) yesterday. She denies using other illicit drugs or alconol at this time. UDS pos for THC. She states that she was previously discharged from [facility] for using cocaine and mushrooms. I discussed with the patient at length on brief psychotic symptoms associated with marijuana use. Patient advised to refrain from using marijuana and to current medications as prescribed. She reports an upcoming court date on 10/18/21 for "kicking in somebody's door." She is a registered sex offender. She states that she is unable to  stay at the local shelters because she is a registered sex offender, and is currently living in her car.   Stay Summary: Whitney Simpson, is a 31 y.o female, with a history, bipolar, depressed type, PTSD, and substance use disorder who presented to [facility] voluntarily, complaining of increased paranoia and auditory hallucination. Per the patient, she can hear voices, voices of baby crying, and people talking. Per the patient, she just was released from [facility] last Thursday. Patient stated she was there for detox from mushrooms and cocaine use. Per the patient, she takes Trileptal, Lexapro, hydroxyzine, Seroquel, and trazodone. Patient is homeless at the moment she is from the [place] area and currently lives in her car.   Patient was admitted to the [facility] continuous assessment unit for overnight observation. Labs obtained included: CMP, CBC, Lipid panel, A1C, BAL, UDS, urine preg, TSH, EKG and covid. Patient was restarted on home medications which included Lexapro 10 mg p.o. daily, Trileptal 300 mg p.o. twice daily, and Seroquel 100 mg p.o. nightly. She reported taking her medications occasionally due to sometimes missing one or two doses. She has tolerated the stated medications on the unit without any side effects. She has been observed overnight and this morning without any psychotic, aggressive, disruptive, or self-injurious behaviors.   With the patient's consent, Whitney Simpson, CSW., attempted to contact the patient's probation officer, Whitney Simpson at [phone] using the name and contact number provided by the patient. However, the number provided was an automated response voicemail. Heather contacted [place] [employer] [place] to verify the patient's probation officer, however she was informed that the patient hasn't been on probation since 2015. Furthermore, she was able to verify that  the patient does have an upcoming court date tomorrow on 10/19/21.  Plan: At this time, patient is stable  for discharge. There is no evidence that the patient is psychotic, delusion or paranoid. Patient denies SI/HI and does not pose a safety risk at this time. I discussed with the patient following up with her outpatient psychiatric and therapist as soon as possible for follow up. Will consult with Whitney Simpson, CSW., to schedule patient for a f/u appointment. Patient is to continue taking current psychotropics as prescribed. Will provide the patient with a 30day prescriptions for Lexapro 10 mg p.o. daily, Trileptal 300 mg p.o. twice daily, and Seroquel 100 mg p.o. nightly. If symptoms worsen, patient is to contact her outpatient provider, return to the [facility], call 911 or present to the nearest ED for an evaluation.   Appointments: Whitney Simpson Beginning on 20 October 2021 @ 0900.  This is a virtual appointment. Appointment for medication management November 14 2021. Will provide resources for the [facility]) for homelessness community resources.   Total Time spent with patient: 30 minutes  Past Psychiatric History: A reported history of bipolar, depressed type, PTSD, and substance use.Patient reports that she was at [facility] in January, June, and July. Patient was last hospitalized at [facility] from 08/11/21-08/18/21 for SI/HI.   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  Social History:  Social History   Substance and Sexual Activity  Alcohol Use Yes   Comment: ocass.     Social History   Substance and Sexual Activity  Drug Use Yes   Types: Marijuana    Social History   Socioeconomic History   Marital status: Single    Spouse name: Not on file   Number of children: Not on file   Years of education: Not on file   Highest education level: Not on file  Occupational History   Not on file  Tobacco Use   Smoking status: Never   Smokeless tobacco: Never  Vaping Use   Vaping Use: Never used  Substance and Sexual Activity   Alcohol use: Yes    Comment: ocass.   Drug use: Yes    Types: Marijuana   Sexual activity: Yes    Birth control/protection: None  Other Topics Concern   Not on file  Social History Narrative   Not on file   Social Determinants of Health   Financial Resource Strain: Not on file  Food Insecurity: Not on file  Transportation Needs: Not on file  Physical Activity: Not on file  Stress: Not on file  Social Connections: Not on file   SDOH:  SDOH Screenings   Alcohol Screen: Low Risk  (08/11/2021)   Alcohol Screen    Last Alcohol Screening Score (AUDIT): 1  Depression (PHQ2-9): Not on file  Financial Resource Strain: Not on file  Food Insecurity: Not on file  Housing: Not on file  Physical Activity: Not on file  Social Connections: Not on file  Stress: Not on file  Tobacco Use: Low Risk  (08/11/2021)   Patient History    Smoking Tobacco Use: Never    Smokeless Tobacco Use: Never    Passive Exposure: Not on file  Transportation Needs: Not on file    Tobacco Cessation:  N/A, patient does not currently use tobacco products  Current Medications:  Current Facility-Administered Medications  Medication Dose Route Frequency Provider Last Rate Last Admin    acetaminophen (TYLENOL) tablet 650 mg  650 mg Oral Q6H PRN Sindy GuadeloupeWilliams, [name], NP   650 mg at 10/18/21 1026   alum & mag hydroxide-simeth (MAALOX/MYLANTA) 200-200-20 MG/5ML suspension 30 mL  30 mL Oral Q4H PRN Sindy GuadeloupeWilliams, [name], NP       escitalopram (LEXAPRO) tablet 10 mg  10 mg Oral Daily Sindy GuadeloupeWilliams, [name], NP   10 mg at 10/18/21 0914   magnesium hydroxide (MILK OF MAGNESIA) suspension 30 mL  30 mL Oral Daily PRN Sindy GuadeloupeWilliams, [name], NP       Oxcarbazepine (TRILEPTAL) tablet 300 mg  300 mg Oral BID Sindy GuadeloupeWilliams, [name], NP   300 mg at 10/18/21 0914   QUEtiapine (SEROQUEL) tablet 100 mg  100 mg Oral QHS Sindy GuadeloupeWilliams, [name], NP       traZODone (DESYREL) tablet 100 mg  100 mg Oral QHS PRN Sindy GuadeloupeWilliams, [name], NP       Current Outpatient Medications  Medication Sig Dispense Refill   escitalopram (LEXAPRO) 10 MG tablet Take 1 tablet (10 mg total) by mouth daily. 30 tablet 0   Oxcarbazepine (TRILEPTAL) 300 MG tablet Take 1 tablet (300 mg total) by mouth 2 (two) times daily. 60 tablet 0   QUEtiapine (SEROQUEL) 100 MG tablet Take 1 tablet (100 mg total) by mouth at bedtime. 30 tablet 0   SUMAtriptan (IMITREX) 25 MG tablet Take 1 tablet (25 mg total) by mouth as needed for migraine. May repeat in 2 hours if headache persists or recurs. 10 tablet 0    PTA Medications: (Not in a hospital admission)       No data to display          Flowsheet Row [facility]) [facility] from [facility]/28/2021 in [facility]  C-SSRS RISK CATEGORY High Risk High Risk No Risk       Musculoskeletal  Strength & Muscle Tone: within normal limits Gait & Station: normal Patient leans: N/A  Psychiatric Specialty Exam  Presentation  General Appearance: Appropriate for Environment  Eye Contact:Fair  Speech:Clear and Coherent  Speech Volume:Normal  Handedness:Ambidextrous   Mood and  Affect  Mood:Euthymic  Affect:Congruent   Thought Process  Thought Processes:Coherent; Linear  Descriptions of Associations:Intact  Orientation:Full (Time, Place and Person)  Thought Content:Logical  Diagnosis of Schizophrenia or Schizoaffective disorder in past: No    Hallucinations:Hallucinations: None  Ideas of Reference:None  Suicidal Thoughts:Suicidal Thoughts: No  Homicidal Thoughts:Homicidal Thoughts: No   Sensorium  Memory:Immediate Fair; Recent Fair; Remote Fair  Judgment:Intact  Insight:Present   Executive  Functions  Concentration:Fair  Attention Span:Fair  Recall:Fair  Fund of Knowledge:Fair  Language:Fair   Psychomotor Activity  Psychomotor Activity:Psychomotor Activity: Normal   Assets  Assets:Communication Skills; Desire for Improvement; Leisure Time; Physical Health; Transportation   Sleep  Sleep:Sleep: Fair Number of Hours of Sleep: 5   Nutritional Assessment (For OBS and FBC admissions only) Has the patient had a weight loss or gain of 10 pounds or more in the last 3 months?: No Has the patient had a decrease in food intake/or appetite?: No Does the patient have dental problems?: No Does the patient have eating habits or behaviors that may be indicators of an eating disorder including binging or inducing vomiting?: No Has the patient recently lost weight without trying?: 0 Has the patient been eating poorly because of a decreased appetite?: 0 Malnutrition Screening Tool Score: 0   Physical Exam  Physical Exam HENT:     Head: Normocephalic.  Cardiovascular:     Rate and Rhythm: Normal rate.  Pulmonary:     Effort: Pulmonary effort is normal.  Musculoskeletal:        General: Normal range of motion.     Cervical back: Normal range of motion.  Neurological:     Mental Status: She is alert and oriented to person, place, and time.    Review of Systems  Constitutional: Negative.   HENT: Negative.    Eyes: Negative.    Respiratory: Negative.    Cardiovascular: Negative.   Gastrointestinal: Negative.   Genitourinary: Negative.   Musculoskeletal: Negative.   Neurological: Negative.   Endo/Heme/Allergies: Negative.    Blood pressure 99/67, pulse 67, temperature 98.2 F (36.8 C), temperature source Oral, resp. rate 18, SpO2 100 %, unknown if currently breastfeeding. There is no height or weight on file to calculate BMI.  Demographic Factors:  Low socioeconomic status, Living alone, and Unemployed  Loss Factors: Legal issues and Financial problems/change in socioeconomic status  Historical Factors: Prior suicide attempts  Risk Reduction Factors:   Responsible for children under 21 years of age and Sense of responsibility to family  Continued Clinical Symptoms:  Alcohol/Substance Abuse/Dependencies Previous Psychiatric Diagnoses and Treatments  Cognitive Features That Contribute To Risk:  None    Suicide Risk:  Minimal: No identifiable suicidal ideation.  Patients presenting with no risk factors but with morbid ruminations; may be classified as minimal risk based on the severity of the depressive symptoms  Plan Of Care/Follow-up recommendations:  Activity:  as tolerate   Thank you for letting us serve you at this time.  Below is information for follow-up appointments as well as resources for other types of services such Partial Hospitalization Program/Intensive Outpatient Program (PHP/IOP).  Also, [facility] does offer treatment that focuses on the brain and goes into a type of neuro-treatment that targets brainwaves and teaches you how to regulate them.  Research in using neurofeedback for mental health treatment has proven to be successful in treating those with PTSD, depression, and anxiety.  It's about regulating the Alpha waves that are in a constant state of being heightened even in the absence of a threat or perceived threat.  You have chosen a great mental health agency as  they offer a wide variety of options that address all aspects of your well-being.  It is important that you utilize those services so that you can have an enhanced quality of life outside of the inpatient setting.   You have a therapy appointment with your therapist at [facility] on 20 October 2021 @ 0900.  This is a virtual appointment.  The office will try to move up your appointment for medication management rather than wait until 14 November 2021. You will be getting a call from them either today or sometime this week.  It is important that you maintain these appointments for your well-being so that you can live the life you want to live.  Partial Hospitalization Program/Intensive Outpatient Program (PHP/IOP)   - Call [phone] and ask about getting a referral made to the facilitators of the intensive outpatient groups. Your information will be taken down and one of the facilitators will contact you to schedule an appointment for an intake and assessment.  Discharge recommendations:  Patient is to take medications as prescribed. Please see information for follow-up appointment with psychiatry and therapy. Please follow up with your primary care provider for all medical related needs.   Therapy: We recommend that patient participate in individual therapy to address mental health concerns.  Medications: The patient is to contact a medical professional and/or outpatient provider to address any new side effects that develop. The patient should update outpatient providers of any new medications and/or medication changes.   Atypical antipsychotics: If you are prescribed an atypical antipsychotic, it is recommended that your height, weight, BMI, blood pressure, fasting lipid panel, and fasting blood sugar be monitored by your outpatient providers.  Safety:  The patient should abstain from use of illicit substances/drugs and abuse of any medications. If symptoms worsen or do not  continue to improve or if the patient becomes actively suicidal or homicidal then it is recommended that the patient return to the closest hospital emergency department, the [facility], or call 911 for further evaluation and treatment. National Suicide Prevention Lifeline 1-800-SUICIDE or 334 798 6578.  About 988 988 offers 24/7 access to trained crisis counselors who can help people experiencing mental health-related distress. People can call or text 988 or chat 988lifeline.org for themselves or if they are worried about a loved one who may need crisis support.   Disposition: Discharge to self.   Mushka Laconte L, NP 10/18/2021, 10:48 AM

## 2021-10-18 NOTE — Discharge Instructions (Addendum)
Good morning,   Thank you for letting us serve you at this time.  Below is information for follow-up appointments as well as resources for other types of services such Partial Hospitalization Program/Intensive Outpatient Program (PHP/IOP).  Also, [facility] does offer treatment that focuses on the brain and goes into a type of neuro-treatment that targets brainwaves and teaches you how to regulate them.  Research in using neurofeedback for mental health treatment has proven to be successful in treating those with PTSD, depression, and anxiety.  It's about regulating the Alpha waves that are in a constant state of being heightened even in the absence of a threat or perceived threat.  You have chosen a great mental health agency as they offer a wide variety of options that address all aspects of your well-being.  It is important that you utilize those services so that you can have an enhanced quality of life outside of the inpatient setting.   You have a therapy appointment with your therapist at [facility] on 20 October 2021 @ 0900.  This is a virtual appointment.  The office will try to move up your appointment for medication management rather than wait until 14 November 2021.  You will be getting a call from them either today or sometime this week.  It is important that you maintain these appointments for your well-being so that you can live the life you want to live.   Partial Hospitalization Program/Intensive Outpatient Program (PHP/IOP)   - Call [phone] and ask about getting a referral made to the facilitators of the intensive outpatient groups. Your information will be taken down and one of the facilitators will contact you to schedule an appointment for an intake and assessment.  In the meantime, it is encouraged that you go to [facility] to see what is beneficial for you there.    [facility] [address],  [place], [address] [phone] phone  Our inviting 22,000-square-foot community center at [address] offers, among other critical resources: showers, Pharmacologist, barbershop, phone bank, mailroom, computer lab, medical clinic, gardens and a bike maintenance area. We welcome you.    Discharge recommendations:  Patient is to take medications as prescribed. Please see information for follow-up appointment with psychiatry and therapy. Please follow up with your primary care provider for all medical related needs.    Therapy: We recommend that patient participate in individual therapy to address mental health concerns.  Medications: The patient is to contact a medical professional and/or outpatient provider to address any new side effects that develop. The patient should update outpatient providers of any new medications and/or medication changes.   Atypical antipsychotics: If you are prescribed an atypical antipsychotic, it is recommended that your height, weight, BMI, blood pressure, fasting lipid panel, and fasting blood sugar be monitored by your outpatient providers.  Safety:  The patient should abstain from use of illicit substances/drugs and abuse of any medications. If symptoms worsen or do not continue to improve or if the patient becomes actively suicidal or homicidal then it is recommended that the patient return to the closest hospital emergency department, the [facility], or call 911 for further evaluation and treatment. National Suicide Prevention Lifeline 1-800-SUICIDE or 715-662-3097.  About 988 988 offers 24/7 access to trained crisis counselors who can help people experiencing mental health-related distress. People can call or text 988 or chat 988lifeline.org for themselves or if they are worried about  a loved one who may need crisis support.

## 2021-10-18 NOTE — ED Notes (Signed)
Refused breakfast.

## 2021-10-18 NOTE — ED Provider Notes (Signed)
[facility] Continuous Assessment Admission H&P  Date: 10/18/21 Patient Name: Whitney Simpson MRN: [id number] Chief Complaint:  Chief Complaint  Patient presents with   Hallucinations      Diagnoses:  Final diagnoses:  Major depression with psychotic features (HCC)  Nonadherence to medical treatment  Anxious appearance    HPI: Whitney Simpson,  31 y.o female, with a history, MDD, PTSD, marijuana use disorder.  Presented to [facility] voluntarily, complaining of increased paranoia and auditory hallucination.  Per the patient she can hear voices, voices of baby crying, people talking.  Per the patient she just was released from [facility] last Thursday.  Patient stated she was there for detox.  Per the patient she takes Trileptal, Lexapro, hydroxyzine, Seroquel, and trazodone.  Patient is homeless at the moment she is from the [place] area.  Per the patient she lives in her car patient reports she was at [facility] in January, June, and July.  TTS notes, Pt currently reports hearing voices (whispers), ventilation and a baby crying. Pt also reports having moments where she is extremely confused and has difficulty thinking. Pt is homeless and lives in her car. Pt reports having an out of body experience last night during an argument with her girlfriend. Pt states " I knew I was there in the car, but I felt like I was somewhere else". Pt observed talking to herself at times. Pt reports being linked to [employer] (therapist) and is compliant with prescribed medications. Pt reports hx of PTSD, anxiety and depression. Pt denies SI, HI. Pt sasy "I have a lot of confusion." She reacts to some internal stimuli during assessment. Pt says she smokes marijuana, last time was yesterday (08/27). Medication prescribed by [facility] in [place], was discharged on 08/24. She was detoxing. Pt says "I don't know what is going on with my mind." Pt says her sleep is not the best due to living in her car. She says her  appetite is WNL. She says she needs help with her past trauma.  Face-to-face observation of patient, patient is alert and oriented x 4, speech is clear, maintaining eye contact.  Patient appearance is casual patient does have a very strong marijuana order.  Per the patient she smoked marijuana today.  Mood is anxious, affect congruent with mood.  Patient denies SI, HI, however patient report auditory hallucination by stating she heard voices of people she also hears screaming of babies and stuff.  Patient patient reports she smoked marijuana daily patient reports she drinks occasionally denies any other illicit drug use.   Recommend inpatient observation  PHQ 2-9:   Flowsheet Row [facility]) [facility] from 08/10/2021 in [facility] EM[facility] ED from 04/28/2021 in [facility]  C-SSRS RISK CATEGORY High Risk High Risk No Risk        Total Time spent with patient: 30 minutes  Musculoskeletal  Strength & Muscle Tone: within normal limits Gait & Station: normal Patient leans: N/A  Psychiatric Specialty Exam  Presentation General Appearance: Casual  Eye Contact:Good  Speech:Clear and Coherent  Speech Volume:Normal  Handedness:Ambidextrous   Mood and Affect  Mood:Anxious  Affect:Congruent   Thought Process  Thought Processes:Coherent  Descriptions of Associations:Circumstantial  Orientation:Full (Time, Place and Person)  Thought Content:Logical  Diagnosis of Schizophrenia or Schizoaffective disorder in past: No  Duration of Psychotic Symptoms: Less than six months  Hallucinations:Hallucinations: None  Ideas of Reference:None  Suicidal Thoughts:Suicidal Thoughts: No  Homicidal Thoughts:Homicidal Thoughts: No   Sensorium  Memory:Immediate Fair  Judgment:Fair  Insight:Fair   Executive Functions  Concentration:Fair  Attention  Span:Fair  Recall:Fair  Fund of Knowledge:Fair  Language:Fair   Psychomotor Activity  Psychomotor Activity:Psychomotor Activity: Normal   Assets  Assets:Desire for Improvement; Housing   Sleep  Sleep:Sleep: Fair   Nutritional Assessment (For OBS and FBC admissions only) Has the patient had a weight loss or gain of 10 pounds or more in the last 3 months?: No Has the patient had a decrease in food intake/or appetite?: No Does the patient have dental problems?: No Does the patient have eating habits or behaviors that may be indicators of an eating disorder including binging or inducing vomiting?: No Has the patient recently lost weight without trying?: 0 Has the patient been eating poorly because of a decreased appetite?: 0 Malnutrition Screening Tool Score: 0    Physical Exam HENT:     Head: Normocephalic.  Cardiovascular:     Rate and Rhythm: Normal rate.  Pulmonary:     Effort: Pulmonary effort is normal.  Musculoskeletal:        General: Normal range of motion.     Cervical back: Normal range of motion.  Skin:    General: Skin is warm.  Neurological:     General: No focal deficit present.     Mental Status: She is alert.  Psychiatric:        Mood and Affect: Mood normal.        Behavior: Behavior normal.        Thought Content: Thought content normal.        Judgment: Judgment normal.    Review of Systems  Constitutional: Negative.   HENT: Negative.    Eyes: Negative.   Respiratory: Negative.    Cardiovascular: Negative.   Gastrointestinal: Negative.   Genitourinary: Negative.   Musculoskeletal: Negative.   Skin: Negative.   Neurological: Negative.   Endo/Heme/Allergies: Negative.   Psychiatric/Behavioral:  Positive for substance abuse. The patient is nervous/anxious.     Blood pressure 126/69, pulse 100, temperature 99.2 F (37.3 C), temperature source Oral, resp. rate 18, SpO2 100 %, unknown if currently breastfeeding. There is no height or  weight on file to calculate BMI.  Past Psychiatric History: PTSD, major depressive disorder,  Is the patient at risk to self? No  Has the patient been a risk to self in the past 6 months? No .    Has the patient been a risk to self within the distant past? No   Is the patient a risk to others? No   Has the patient been a risk to others in the past 6 months? No   Has the patient been a risk to others within the distant past? 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   Procedure: TOTAL HIP ARTHROPLASTY ANTERIOR APPROACH;  Surgeon: Jodi Geralds, MD;  Location: [facility];  Service: Orthopedics;  Laterality: Right;    Family History:  Family History  Problem Relation Age of Onset  Hypertension Mother    Hypertension Father    Diabetes Other    Hypertension Other    Anesthesia problems Neg Hx    Hypotension Neg Hx    Malignant hyperthermia Neg Hx    Pseudochol deficiency Neg Hx     Social History:  Social History   Socioeconomic History   Marital status: Single    Spouse name: Not on file   Number of children: Not on file   Years of education: Not on file   Highest education level: Not on file  Occupational History   Not on file  Tobacco Use   Smoking status: Never   Smokeless tobacco: Never  Vaping Use   Vaping Use: Never used  Substance and Sexual Activity   Alcohol use: Yes    Comment: ocass.   Drug use: Yes    Types: Marijuana   Sexual activity: Yes    Birth control/protection: None  Other Topics Concern   Not on file  Social History Narrative   Not on file   Social Determinants of Health   Financial Resource Strain: Not on file  Food Insecurity: Not on  file  Transportation Needs: Not on file  Physical Activity: Not on file  Stress: Not on file  Social Connections: Not on file  Intimate Partner Violence: Not on file    SDOH:  SDOH Screenings   Alcohol Screen: Low Risk  (08/11/2021)   Alcohol Screen    Last Alcohol Screening Score (AUDIT): 1  Depression (PHQ2-9): Not on file  Financial Resource Strain: Not on file  Food Insecurity: Not on file  Housing: Not on file  Physical Activity: Not on file  Social Connections: Not on file  Stress: Not on file  Tobacco Use: Low Risk  (08/11/2021)   Patient History    Smoking Tobacco Use: Never    Smokeless Tobacco Use: Never    Passive Exposure: Not on file  Transportation Needs: Not on file    Last Labs:  Admission on 10/17/2021  Component Date Value Ref Range Status   SARS Coronavirus 2 by RT PCR 10/18/2021 NEGATIVE  NEGATIVE Final   Comment: (NOTE) SARS-CoV-2 target nucleic acids are NOT DETECTED.  The SARS-CoV-2 RNA is generally detectable in upper respiratory specimens during the acute phase of infection. The lowest concentration of SARS-CoV-2 viral copies this assay can detect is 138 copies/mL. A negative result does not preclude SARS-Cov-2 infection and should not be used as the sole basis for treatment or other patient management decisions. A negative result may occur with  improper specimen collection/handling, submission of specimen other than nasopharyngeal swab, presence of viral mutation(s) within the areas targeted by this assay, and inadequate number of viral copies(<138 copies/mL). A negative result must be combined with clinical observations, patient history, and epidemiological information. The expected result is Negative.  Fact Sheet for Patients:  BloggerCourse.com  Fact Sheet for Healthcare Providers:  SeriousBroker.it  This test is no                          t yet approved or cleared by the [place] FDA and  has been authorized for detection and/or diagnosis of SARS-CoV-2 by FDA under an Emergency Use Authorization (EUA). This EUA will remain  in effect (meaning this test can be used) for the duration of the COVID-19 declaration under Section 564(b)(1) of the Act, 21 U.S.C.section 360bbb-3(b)(1), unless the authorization is terminated  or revoked sooner.  Influenza A by PCR 10/18/2021 NEGATIVE  NEGATIVE Final   Influenza B by PCR 10/18/2021 NEGATIVE  NEGATIVE Final   Comment: (NOTE) The Xpert Xpress SARS-CoV-2/FLU/RSV plus assay is intended as an aid in the diagnosis of influenza from Nasopharyngeal swab specimens and should not be used as a sole basis for treatment. Nasal washings and aspirates are unacceptable for Xpert Xpress SARS-CoV-2/FLU/RSV testing.  Fact Sheet for Patients: BloggerCourse.com  Fact Sheet for Healthcare Providers: SeriousBroker.it  This test is not yet approved or cleared by the [place] FDA and has been authorized for detection and/or diagnosis of SARS-CoV-2 by FDA under an Emergency Use Authorization (EUA). This EUA will remain in effect (meaning this test can be used) for the duration of the COVID-19 declaration under Section 564(b)(1) of the Act, 21 U.S.C. section 360bbb-3(b)(1), unless the authorization is terminated or revoked.  Performed at [facility], [address] [place], [place] [address]    WBC 10/18/2021 8.3  4.0 - 10.5 K/uL Final   RBC 10/18/2021 4.08  3.87 - 5.11 MIL/uL Final   Hemoglobin 10/18/2021 12.8  12.0 - 15.0 g/dL Final   HCT 55/73/2202 37.4  36.0 - 46.0 % Final   MCV 10/18/2021 91.7  80.0 - 100.0 fL Final   MCH 10/18/2021 31.4  26.0 - 34.0 pg Final   MCHC 10/18/2021 34.2  30.0 - 36.0 g/dL Final   RDW 54/27/0623 12.1  11.5 - 15.5 % Final   Platelets 10/18/2021 202  150 - 400 K/uL Final   nRBC 10/18/2021 0.0  0.0 - 0.2 % Final   Neutrophils  Relative % 10/18/2021 67  % Final   Neutro Abs 10/18/2021 5.5  1.7 - 7.7 K/uL Final   Lymphocytes Relative 10/18/2021 22  % Final   Lymphs Abs 10/18/2021 1.9  0.7 - 4.0 K/uL Final   Monocytes Relative 10/18/2021 9  % Final   Monocytes Absolute 10/18/2021 0.7  0.1 - 1.0 K/uL Final   Eosinophils Relative 10/18/2021 2  % Final   Eosinophils Absolute 10/18/2021 0.2  0.0 - 0.5 K/uL Final   Basophils Relative 10/18/2021 0  % Final   Basophils Absolute 10/18/2021 0.0  0.0 - 0.1 K/uL Final   Immature Granulocytes 10/18/2021 0  % Final   Abs Immature Granulocytes 10/18/2021 0.03  0.00 - 0.07 K/uL Final   Performed at [facility], [address], [place], [place] [address]   Sodium 10/18/2021 139  135 - 145 mmol/L Final   Potassium 10/18/2021 4.1  3.5 - 5.1 mmol/L Final   Chloride 10/18/2021 106  98 - 111 mmol/L Final   CO2 10/18/2021 26  22 - 32 mmol/L Final   Glucose, Bld 10/18/2021 88  70 - 99 mg/dL Final   Glucose reference range applies only to samples taken after fasting for at least 8 hours.   BUN 10/18/2021 12  6 - 20 mg/dL Final   Creatinine, Ser 10/18/2021 0.71  0.44 - 1.00 mg/dL Final   Calcium 15/17/6160 9.6  8.9 - 10.3 mg/dL Final   Total Protein 73/71/0626 6.8  6.5 - 8.1 g/dL Final   Albumin 94/85/4627 4.1  3.5 - 5.0 g/dL Final   AST 03/50/0938 27  15 - 41 U/L Final   ALT 10/18/2021 27  0 - 44 U/L Final   Alkaline Phosphatase 10/18/2021 107  38 - 126 U/L Final   Total Bilirubin 10/18/2021 0.4  0.3 - 1.2 mg/dL Final   GFR, Estimated 10/18/2021 >60  >60  mL/min Final   Comment: (NOTE) Calculated using the CKD-EPI Creatinine Equation (2021)    Anion gap 10/18/2021 7  5 - 15 Final   Performed at [facility], [address] [place], [place] [address]   Alcohol, Ethyl (B) 10/18/2021 <10  <10 mg/dL Final   Comment: (NOTE) Lowest detectable limit for serum alcohol is 10 mg/dL.  For medical purposes only. Performed at [facility], [address]  [place], [place] [address]    Cholesterol 10/18/2021 143  0 - 200 mg/dL Final   Triglycerides 86/57/8469 59  <150 mg/dL Final   HDL 62/95/2841 58  >40 mg/dL Final   Total CHOL/HDL Ratio 10/18/2021 2.5  RATIO Final   VLDL 10/18/2021 12  0 - 40 mg/dL Final   LDL Cholesterol 10/18/2021 73  0 - 99 mg/dL Final   Comment:        Total Cholesterol/HDL:CHD Risk Coronary Heart Disease Risk Table                     Men   Women  1/2 Average Risk   3.4   3.3  Average Risk       5.0   4.4  2 X Average Risk   9.6   7.1  3 X Average Risk  23.4   11.0        Use the calculated Patient Ratio above and the CHD Risk Table to determine the patient's CHD Risk.        ATP III CLASSIFICATION (LDL):  <100     mg/dL   Optimal  324-401  mg/dL   Near or Above                    Optimal  130-159  mg/dL   Borderline  027-253  mg/dL   High  >664     mg/dL   Very High Performed at [facility], [address] [place], [place] [address]    TSH 10/18/2021 0.437  0.350 - 4.500 uIU/mL Final   Comment: Performed by a 3rd Generation assay with a functional sensitivity of <=0.01 uIU/mL. Performed at [facility], [address] [place], [place] [address]    POC Amphetamine UR 10/18/2021 None Detected  NONE DETECTED (Cut Off Level 1000 ng/mL) Final   POC Secobarbital (BAR) 10/18/2021 None Detected  NONE DETECTED (Cut Off Level 300 ng/mL) Final   POC Buprenorphine (BUP) 10/18/2021 None Detected  NONE DETECTED (Cut Off Level 10 ng/mL) Final   POC Oxazepam (BZO) 10/18/2021 None Detected  NONE DETECTED (Cut Off Level 300 ng/mL) Final   POC Cocaine UR 10/18/2021 None Detected  NONE DETECTED (Cut Off Level 300 ng/mL) Final   POC Methamphetamine UR 10/18/2021 None Detected  NONE DETECTED (Cut Off Level 1000 ng/mL) Final   POC Morphine 10/18/2021 None Detected  NONE DETECTED (Cut Off Level 300 ng/mL) Final   POC Methadone UR 10/18/2021 None Detected  NONE DETECTED (Cut Off Level 300 ng/mL) Final   POC Oxycodone  UR 10/18/2021 None Detected  NONE DETECTED (Cut Off Level 100 ng/mL) Final   POC Marijuana UR 10/18/2021 Positive (A)  NONE DETECTED (Cut Off Level 50 ng/mL) Final   SARSCOV2ONAVIRUS 2 AG 10/18/2021 NEGATIVE  NEGATIVE Final   Comment: (NOTE) SARS-CoV-2 antigen NOT DETECTED.   Negative results are presumptive.  Negative results do not preclude SARS-CoV-2 infection and should not be used as the sole basis for treatment or  other patient management decisions, including infection  control decisions, particularly in the presence of clinical signs and  symptoms consistent with COVID-19, or in those who have been in contact with the virus.  Negative results must be combined with clinical observations, patient history, and epidemiological information. The expected result is Negative.  Fact Sheet for Patients: https://www.jennings-kim.com/  Fact Sheet for Healthcare Providers: https://alexander-rogers.biz/  This test is not yet approved or cleared by the [place] FDA and  has been authorized for detection and/or diagnosis of SARS-CoV-2 by FDA under an Emergency Use Authorization (EUA).  This EUA will remain in effect (meaning this test can be used) for the duration of  the COV                          ID-19 declaration under Section 564(b)(1) of the Act, 21 U.S.C. section 360bbb-3(b)(1), unless the authorization is terminated or revoked sooner.     Preg Test, Ur 10/18/2021 NEGATIVE  NEGATIVE Final   Comment:        THE SENSITIVITY OF THIS METHODOLOGY IS >24 mIU/mL   Admission on 08/11/2021, Discharged on 08/17/2021  Component Date Value Ref Range Status   TSH 08/12/2021 1.437  0.350 - 4.500 uIU/mL Final   Comment: Performed by a 3rd Generation assay with a functional sensitivity of <=0.01 uIU/mL. Performed at [facility], [address] [place], [place] [address]    Cholesterol 08/12/2021 163  0 - 200 mg/dL Final   Triglycerides  08/12/2021 119  <150 mg/dL Final   HDL 44/02/270 52  >40 mg/dL Final   Total CHOL/HDL Ratio 08/12/2021 3.1  RATIO Final   VLDL 08/12/2021 24  0 - 40 mg/dL Final   LDL Cholesterol 08/12/2021 87  0 - 99 mg/dL Final   Comment:        Total Cholesterol/HDL:CHD Risk Coronary Heart Disease Risk Table                     Men   Women  1/2 Average Risk   3.4   3.3  Average Risk       5.0   4.4  2 X Average Risk   9.6   7.1  3 X Average Risk  23.4   11.0        Use the calculated Patient Ratio above and the CHD Risk Table to determine the patient's CHD Risk.        ATP III CLASSIFICATION (LDL):  <100     mg/dL   Optimal  536-644  mg/dL   Near or Above                    Optimal  130-159  mg/dL   Borderline  034-742  mg/dL   High  >595     mg/dL   Very High Performed at [facility], [address], [place], [place] [address]    Hgb A1c MFr Bld 08/12/2021 5.2  4.8 - 5.6 % Final   Comment: (NOTE) Pre diabetes:          5.7%-6.4%  Diabetes:              >6.4%  Glycemic control for   <7.0% adults with diabetes    Mean Plasma Glucose 08/12/2021 102.54  mg/dL Final   Performed at [facility], [address], [place], [place] [address]   Color, Urine 08/13/2021 YELLOW  YELLOW Final   APPearance 08/13/2021 CLEAR  CLEAR Final   Specific Gravity, Urine 08/13/2021 1.015  1.005 - 1.030 Final   pH 08/13/2021 6.0  5.0 - 8.0 Final   Glucose, UA 08/13/2021 NEGATIVE  NEGATIVE mg/dL Final   Hgb urine dipstick 08/13/2021 NEGATIVE  NEGATIVE Final   Bilirubin Urine 08/13/2021 NEGATIVE  NEGATIVE Final   Ketones, ur 08/13/2021 NEGATIVE  NEGATIVE mg/dL Final   Protein, ur 16/11/9602 NEGATIVE  NEGATIVE mg/dL Final   Nitrite 54/10/8117 NEGATIVE  NEGATIVE Final   Leukocytes,Ua 08/13/2021 NEGATIVE  NEGATIVE Final   RBC / HPF 08/13/2021 0-5  0 - 5 RBC/hpf Final   WBC, UA 08/13/2021 0-5  0 - 5 WBC/hpf Final   Bacteria, UA 08/13/2021 NONE SEEN  NONE SEEN Final   Squamous  Epithelial / LPF 08/13/2021 6-10  0 - 5 Final   Mucus 08/13/2021 PRESENT   Final   Performed at [facility], [address] [place], [place] [address]  Admission on 08/10/2021, Discharged on 08/11/2021  Component Date Value Ref Range Status   Sodium 08/10/2021 140  135 - 145 mmol/L Final   Potassium 08/10/2021 4.1  3.5 - 5.1 mmol/L Final   Chloride 08/10/2021 110  98 - 111 mmol/L Final   CO2 08/10/2021 23  22 - 32 mmol/L Final   Glucose, Bld 08/10/2021 96  70 - 99 mg/dL Final   Glucose reference range applies only to samples taken after fasting for at least 8 hours.   BUN 08/10/2021 12  6 - 20 mg/dL Final   Creatinine, Ser 08/10/2021 0.91  0.44 - 1.00 mg/dL Final   Calcium 95/62/1308 8.9  8.9 - 10.3 mg/dL Final   Total Protein 65/78/4696 5.5 (L)  6.5 - 8.1 g/dL Final   Albumin 29/52/8413 3.1 (L)  3.5 - 5.0 g/dL Final   AST 24/40/1027 16  15 - 41 U/L Final   ALT 08/10/2021 13  0 - 44 U/L Final   Alkaline Phosphatase 08/10/2021 76  38 - 126 U/L Final   Total Bilirubin 08/10/2021 0.2 (L)  0.3 - 1.2 mg/dL Final   GFR, Estimated 08/10/2021 >60  >60 mL/min Final   Comment: (NOTE) Calculated using the CKD-EPI Creatinine Equation (2021)    Anion gap 08/10/2021 7  5 - 15 Final   Performed at [facility], [address], [place], [place] [address]   Alcohol, Ethyl (B) 08/10/2021 <10  <10 mg/dL Final   Comment: (NOTE) Lowest detectable limit for serum alcohol is 10 mg/dL.  For medical purposes only. Performed at [facility], [address] [place], [place] [address]    Salicylate Lvl 08/10/2021 <7.0 (L)  7.0 - 30.0 mg/dL Final   Performed at [facility], [address], [place], [place] [address]   Acetaminophen (Tylenol), Serum 08/10/2021 <10 (L)  10 - 30 ug/mL Final   Comment: (NOTE) Therapeutic concentrations vary significantly. A range of 10-30 ug/mL  may be an effective concentration for many patients. However, some  are best treated at  concentrations outside of this range. Acetaminophen concentrations >150 ug/mL at 4 hours after ingestion  and >50 ug/mL at 12 hours after ingestion are often associated with  toxic reactions.  Performed at [facility], [address], [place], [place] [address]    WBC 08/10/2021 6.4  4.0 - 10.5 K/uL Final   RBC 08/10/2021 4.02  3.87 - 5.11 MIL/uL Final   Hemoglobin 08/10/2021 12.7  12.0 -  15.0 g/dL Final   HCT 77/41/2878 37.9  36.0 - 46.0 % Final   MCV 08/10/2021 94.3  80.0 - 100.0 fL Final   MCH 08/10/2021 31.6  26.0 - 34.0 pg Final   MCHC 08/10/2021 33.5  30.0 - 36.0 g/dL Final   RDW 67/67/2094 12.9  11.5 - 15.5 % Final   Platelets 08/10/2021 167  150 - 400 K/uL Final   nRBC 08/10/2021 0.0  0.0 - 0.2 % Final   Performed at [facility], [address], [place], [place] [address]   Opiates 08/11/2021 NONE DETECTED  NONE DETECTED Final   Cocaine 08/11/2021 NONE DETECTED  NONE DETECTED Final   Benzodiazepines 08/11/2021 NONE DETECTED  NONE DETECTED Final   Amphetamines 08/11/2021 NONE DETECTED  NONE DETECTED Final   Tetrahydrocannabinol 08/11/2021 POSITIVE (A)  NONE DETECTED Final   Barbiturates 08/11/2021 NONE DETECTED  NONE DETECTED Final   Comment: (NOTE) DRUG SCREEN FOR MEDICAL PURPOSES ONLY.  IF CONFIRMATION IS NEEDED FOR ANY PURPOSE, NOTIFY LAB WITHIN 5 DAYS.  LOWEST DETECTABLE LIMITS FOR URINE DRUG SCREEN Drug Class                     Cutoff (ng/mL) Amphetamine and metabolites    1000 Barbiturate and metabolites    200 Benzodiazepine                 200 Tricyclics and metabolites     300 Opiates and metabolites        300 Cocaine and metabolites        300 THC                            50 Performed at [facility], [address] [place] [address] [place], [place] [address]    I-stat hCG, quantitative 08/10/2021 <5.0  <5 mIU/mL Final   Comment 3 08/10/2021          Final   Comment:   GEST. AGE      CONC.  (mIU/mL)   <=1 WEEK        5 - 50     2 WEEKS       50  - 500     3 WEEKS       100 - 10,000     4 WEEKS     1,000 - 30,000        FEMALE AND NON-PREGNANT FEMALE:     LESS THAN 5 mIU/mL    SARS Coronavirus 2 by RT PCR 08/10/2021 NEGATIVE  NEGATIVE Final   Comment: (NOTE) SARS-CoV-2 target nucleic acids are NOT DETECTED.  The SARS-CoV-2 RNA is generally detectable in upper respiratory specimens during the acute phase of infection. The lowest concentration of SARS-CoV-2 viral copies this assay can detect is 138 copies/mL. A negative result does not preclude SARS-Cov-2 infection and should not be used as the sole basis for treatment or other patient management decisions. A negative result may occur with  improper specimen collection/handling, submission of specimen other than nasopharyngeal swab, presence of viral mutation(s) within the areas targeted by this assay, and inadequate number of viral copies(<138 copies/mL). A negative result must be combined with clinical observations, patient history, and epidemiological information. The expected result is Negative.  Fact Sheet for Patients:  BloggerCourse.com  Fact Sheet for Healthcare Providers:  SeriousBroker.it  This test is no  t yet approved or cleared by the [place] FDA and  has been authorized for detection and/or diagnosis of SARS-CoV-2 by FDA under an Emergency Use Authorization (EUA). This EUA will remain  in effect (meaning this test can be used) for the duration of the COVID-19 declaration under Section 564(b)(1) of the Act, 21 U.S.C.section 360bbb-3(b)(1), unless the authorization is terminated  or revoked sooner.       Influenza A by PCR 08/10/2021 NEGATIVE  NEGATIVE Final   Influenza B by PCR 08/10/2021 NEGATIVE  NEGATIVE Final   Comment: (NOTE) The Xpert Xpress SARS-CoV-2/FLU/RSV plus assay is intended as an aid in the diagnosis of influenza from Nasopharyngeal swab specimens and should  not be used as a sole basis for treatment. Nasal washings and aspirates are unacceptable for Xpert Xpress SARS-CoV-2/FLU/RSV testing.  Fact Sheet for Patients: BloggerCourse.comhttps://www.fda.gov/media/152166/download  Fact Sheet for Healthcare Providers: SeriousBroker.ithttps://www.fda.gov/media/152162/download  This test is not yet approved or cleared by the [place] FDA and has been authorized for detection and/or diagnosis of SARS-CoV-2 by FDA under an Emergency Use Authorization (EUA). This EUA will remain in effect (meaning this test can be used) for the duration of the COVID-19 declaration under Section 564(b)(1) of the Act, 21 U.S.C. section 360bbb-3(b)(1), unless the authorization is terminated or revoked.  Performed at [facility], [address] [place], [place] [address]   Admission on 04/28/2021, Discharged on 04/28/2021  Component Date Value Ref Range Status   Sodium 04/28/2021 138  135 - 145 mmol/L Final   Potassium 04/28/2021 3.8  3.5 - 5.1 mmol/L Final   Chloride 04/28/2021 107  98 - 111 mmol/L Final   CO2 04/28/2021 26  22 - 32 mmol/L Final   Glucose, Bld 04/28/2021 100 (H)  70 - 99 mg/dL Final   Glucose reference range applies only to samples taken after fasting for at least 8 hours.   BUN 04/28/2021 9  6 - 20 mg/dL Final   Creatinine, Ser 04/28/2021 0.65  0.44 - 1.00 mg/dL Final   Calcium 60/45/409803/10/2021 8.8 (L)  8.9 - 10.3 mg/dL Final   GFR, Estimated 04/28/2021 >60  >60 mL/min Final   Comment: (NOTE) Calculated using the CKD-EPI Creatinine Equation (2021)    Anion gap 04/28/2021 5  5 - 15 Final   Performed at [facility], [address] [place], [place] [address]   WBC 04/28/2021 8.0  4.0 - 10.5 K/uL Final   RBC 04/28/2021 5.14 (H)  3.87 - 5.11 MIL/uL Final   Hemoglobin 04/28/2021 15.9 (H)  12.0 - 15.0 g/dL Final   HCT 78/29/562103/10/2021 48.6 (H)  36.0 - 46.0 % Final   MCV 04/28/2021 94.6  80.0 - 100.0 fL Final   MCH 04/28/2021 30.9  26.0 - 34.0 pg Final   MCHC  04/28/2021 32.7  30.0 - 36.0 g/dL Final   RDW 30/86/578403/10/2021 13.1  11.5 - 15.5 % Final   Platelets 04/28/2021 204  150 - 400 K/uL Final   nRBC 04/28/2021 0.0  0.0 - 0.2 % Final   Performed at [facility], [address] [place], [place] [address]   Troponin I (High Sensitivity) 04/28/2021 <2  <18 ng/L Final   Comment: (NOTE) Elevated high sensitivity troponin I (hsTnI) values and significant  changes across serial measurements may suggest ACS but many other  chronic and acute conditions are known to elevate hsTnI results.  Refer to the "Links" section for chest pain algorithms and additional  guidance. Performed at [facility], [address], [place], [place] [address]    I-stat hCG, quantitative 04/28/2021 <5.0  <5 mIU/mL Final   Comment 3 04/28/2021          Final   Comment:   GEST. AGE      CONC.  (mIU/mL)   <=1 WEEK        5 - 50     2 WEEKS       50 - 500     3 WEEKS       100 - 10,000     4 WEEKS     1,000 - 30,000        FEMALE AND NON-PREGNANT FEMALE:     LESS THAN 5 mIU/mL     Allergies: Penicillins, Beef-derived products, Chicken protein, Fish-derived products, and Pork-derived products  PTA Medications: (Not in a hospital admission)   Medical Decision Making  Inpatient observation Lab Orders         Resp Panel by RT-PCR (Flu A&B, Covid) Anterior Nasal Swab         CBC with Differential/Platelet         Comprehensive metabolic panel         Ethanol         Lipid panel         TSH         Pregnancy, urine         POCT Urine Drug Screen - (I-Screen)         POC SARS Coronavirus 2 Ag         Pregnancy, urine POC      Meds ordered this encounter  Medications   acetaminophen (TYLENOL) tablet 650 mg   alum & mag hydroxide-simeth (MAALOX/MYLANTA) 200-200-20 MG/5ML suspension 30 mL   magnesium hydroxide (MILK OF MAGNESIA) suspension 30 mL   escitalopram (LEXAPRO) tablet 10 mg   Oxcarbazepine (TRILEPTAL) tablet 300 mg    QUEtiapine (SEROQUEL) tablet 100 mg   traZODone (DESYREL) tablet 100 mg       Recommendations  Based on my evaluation the patient does not appear to have an emergency medical condition.  Sindy Guadeloupe, NP 10/18/21  5:32 AM
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
## 2021-10-18 NOTE — BH Assessment (Signed)
LCSW Progress Note   0946 - LCSW attempted to contact pt's probation officer, Lawernce Pitts @ [phone], but there was no answer and not identifying information on the voicemail prompt confirming that this phone number belonged to Ms. Horeoberker.  LCSW then contacted the probation office for [facility] to determine who her probation officer is.  They could not locate an officer by that name on their roster.  LCSW then contacted the probation office for [facility] and learned that the last time the patient was on probation statewide was in 2015.  It was confirmed that the patient has a Clinical research associate, Normand Sloop in [place], [place], [phone].  Patient has a court date on 19 October 2021.  LCSW staffed this with patient's provider, Liborio Nixon, NP.  [phone] - LCSW contacted [facility] to confirm follow-up appointments for therapy and medication management. LCSW was informed that the medication management appointment was scheduled on 14 November 2021 because the patient had communicated to the staff that that was when she expected to be discharged from inpatient psychiatric hospitalization.  Pt has an appointment for therapy on 20 October 2021 @ 0900.  Hansel Starling, MSW, LCSW [facility] [phone] phone

## 2021-10-18 NOTE — ED Notes (Signed)
Patient admitted to [facility] endorsing auditory hallucinations and confusion. Denies visual hallucinations, SI, HI. Patient was cooperative during the admission assessment. Skin assessment complete. Belongings inventoried. Patient oriented to unit and unit rules. Meal and drinks offered to patient. Patient verbalized agreement to treatment plans. Patient verbally contracts for safety during hospitalization. Will continue to monitor for safety.
